# Patient Record
Sex: Male | Born: 1980
Health system: Southern US, Community
[De-identification: ages and names within clinical notes are randomized; demographics above are authoritative.]

## PROBLEM LIST (undated history)

## (undated) DIAGNOSIS — S069XAA Unspecified intracranial injury with loss of consciousness status unknown, initial encounter: Secondary | ICD-10-CM

## (undated) DIAGNOSIS — G43909 Migraine, unspecified, not intractable, without status migrainosus: Secondary | ICD-10-CM

## (undated) DIAGNOSIS — S069X9A Unspecified intracranial injury with loss of consciousness of unspecified duration, initial encounter: Secondary | ICD-10-CM

## (undated) DIAGNOSIS — F32A Depression, unspecified: Secondary | ICD-10-CM

## (undated) DIAGNOSIS — F329 Major depressive disorder, single episode, unspecified: Secondary | ICD-10-CM

## (undated) HISTORY — DX: Major depressive disorder, single episode, unspecified: F32.9

## (undated) HISTORY — DX: Migraine, unspecified, not intractable, without status migrainosus: G43.909

## (undated) HISTORY — DX: Unspecified intracranial injury with loss of consciousness status unknown, initial encounter: S06.9XAA

## (undated) HISTORY — PX: ABDOMINAL SURGERY: SHX537

## (undated) HISTORY — PX: TRACHEAL SURGERY: SHX1096

## (undated) HISTORY — DX: Depression, unspecified: F32.A

## (undated) HISTORY — DX: Unspecified intracranial injury with loss of consciousness of unspecified duration, initial encounter: S06.9X9A

---

## 1999-05-28 ENCOUNTER — Emergency Department (HOSPITAL_COMMUNITY): Admission: EM | Admit: 1999-05-28 | Discharge: 1999-05-28 | Payer: Self-pay | Admitting: Emergency Medicine

## 1999-05-31 ENCOUNTER — Encounter: Payer: Self-pay | Admitting: Emergency Medicine

## 1999-05-31 ENCOUNTER — Emergency Department (HOSPITAL_COMMUNITY): Admission: EM | Admit: 1999-05-31 | Discharge: 1999-05-31 | Payer: Self-pay | Admitting: Emergency Medicine

## 2000-03-19 ENCOUNTER — Emergency Department (HOSPITAL_COMMUNITY): Admission: EM | Admit: 2000-03-19 | Discharge: 2000-03-19 | Payer: Self-pay | Admitting: Emergency Medicine

## 2000-03-19 ENCOUNTER — Encounter: Payer: Self-pay | Admitting: Emergency Medicine

## 2002-01-26 ENCOUNTER — Inpatient Hospital Stay (HOSPITAL_COMMUNITY): Admission: AC | Admit: 2002-01-26 | Discharge: 2002-03-06 | Payer: Self-pay

## 2002-01-26 ENCOUNTER — Encounter: Payer: Self-pay | Admitting: General Surgery

## 2002-01-28 ENCOUNTER — Encounter: Payer: Self-pay | Admitting: General Surgery

## 2002-01-29 ENCOUNTER — Encounter: Payer: Self-pay | Admitting: Neurosurgery

## 2002-01-29 ENCOUNTER — Encounter: Payer: Self-pay | Admitting: General Surgery

## 2002-01-30 ENCOUNTER — Encounter: Payer: Self-pay | Admitting: General Surgery

## 2002-01-31 ENCOUNTER — Encounter: Payer: Self-pay | Admitting: General Surgery

## 2002-02-01 ENCOUNTER — Encounter: Payer: Self-pay | Admitting: General Surgery

## 2002-02-02 ENCOUNTER — Encounter: Payer: Self-pay | Admitting: General Surgery

## 2002-02-04 ENCOUNTER — Encounter: Payer: Self-pay | Admitting: Neurosurgery

## 2002-02-05 ENCOUNTER — Encounter: Payer: Self-pay | Admitting: General Surgery

## 2002-02-06 ENCOUNTER — Encounter: Payer: Self-pay | Admitting: General Surgery

## 2002-02-06 ENCOUNTER — Encounter: Payer: Self-pay | Admitting: Neurosurgery

## 2002-02-08 ENCOUNTER — Encounter: Payer: Self-pay | Admitting: General Surgery

## 2002-02-09 ENCOUNTER — Encounter: Payer: Self-pay | Admitting: General Surgery

## 2002-02-11 ENCOUNTER — Encounter: Payer: Self-pay | Admitting: General Surgery

## 2002-02-12 ENCOUNTER — Encounter: Payer: Self-pay | Admitting: General Surgery

## 2002-02-16 ENCOUNTER — Encounter: Payer: Self-pay | Admitting: General Surgery

## 2002-02-19 ENCOUNTER — Encounter: Payer: Self-pay | Admitting: General Surgery

## 2002-02-23 ENCOUNTER — Encounter: Payer: Self-pay | Admitting: General Surgery

## 2002-03-31 ENCOUNTER — Emergency Department (HOSPITAL_COMMUNITY): Admission: EM | Admit: 2002-03-31 | Discharge: 2002-03-31 | Payer: Self-pay | Admitting: Emergency Medicine

## 2002-04-09 ENCOUNTER — Encounter: Payer: Self-pay | Admitting: General Surgery

## 2002-04-09 ENCOUNTER — Encounter: Admission: RE | Admit: 2002-04-09 | Discharge: 2002-04-09 | Payer: Self-pay | Admitting: General Surgery

## 2002-04-10 ENCOUNTER — Encounter (INDEPENDENT_AMBULATORY_CARE_PROVIDER_SITE_OTHER): Payer: Self-pay | Admitting: *Deleted

## 2002-04-10 ENCOUNTER — Ambulatory Visit (HOSPITAL_BASED_OUTPATIENT_CLINIC_OR_DEPARTMENT_OTHER): Admission: RE | Admit: 2002-04-10 | Discharge: 2002-04-10 | Payer: Self-pay | Admitting: General Surgery

## 2007-12-23 ENCOUNTER — Emergency Department (HOSPITAL_COMMUNITY): Admission: EM | Admit: 2007-12-23 | Discharge: 2007-12-24 | Payer: Self-pay | Admitting: Emergency Medicine

## 2010-10-15 NOTE — Op Note (Signed)
NAME:  Harold Stevens, Harold Stevens                        ACCOUNT NO.:  [id number]   MEDICAL RECORD NO.:  [id number]                   PATIENT TYPE:  AMB   LOCATION:  DSC                                  FACILITY:  [facility]   PHYSICIAN:  Jimmye Norman III, M.D.               DATE OF BIRTH:  Nov 08, 1980   DATE OF PROCEDURE:  04/10/2002  DATE OF DISCHARGE:                                 OPERATIVE REPORT   PREOPERATIVE DIAGNOSIS:  Sebaceous cyst of the left posterior neck.   POSTOPERATIVE DIAGNOSIS:  Sebaceous cyst of the left posterior neck.   OPERATION:  Excision of sebaceous cyst of left posterior neck.   SURGEON:  Jimmye Norman, M.D.   ASSISTANT:  None.   ANESTHESIA:  Monitored anesthesia care with 1% Xylocaine local with  epinephrine, 12 cc were used.   COMPLICATIONS:  None.   CONDITION:  Stable.   INDICATIONS FOR PROCEDURE:  The patient is well known to me as a victim of a  trauma who developed a sebaceous cyst on the left posterior neck which has  been treated with p.o. antibiotics and recurred in size significantly who  now comes in for excision.   DESCRIPTION OF PROCEDURE:  The patient was placed in the prone position with  monitored anesthesia care and oxygenation.  He was subsequently prepped in  the usual sterile manner exposing the cyst on the posterior part of the left  neck.   The cyst itself measured approximately 3 cm in maximal diameter and there  was a punctum in the center.  We anesthetized the total length of 5-5.5-6 cm  in an oval pattern around the cyst on the posterior neck.  A skin marker was  used to maintain adequate orientation.  We then used a 15 blade to excise an  oval piece of skin from the posterior neck enclosing the palpated nodule on  the neck.  Care was taken not to enter the cavity; however, there was small  nick with drainage of some fluid from the cavity during excision.  We did  get the complete capsule of the cyst out of the subcutaneous tissue down  to  the posterior neck fascia.  We irrigated with lidocaine and also with saline  prior to closure and removed the oval piece of tissue with enclosed cyst.   We then closed in two layers with a deep 3-0 Vicryl layer reapproximating  the subcu and then the skin was closed using interrupted 4-0 Prolene  sutures.  Approximately seven sutures interrupted simple stitches were  placed on the skin.  Antibiotic ointment was subsequently placed.  The  patient did get preoperative antibiotics.   The patient was then transferred to the recovery room in stable condition.  Kathrin Ruddy, M.D.    JW/MEDQ  D:  04/10/2002  T:  04/10/2002  Job:  [id number]

## 2012-08-22 DIAGNOSIS — G43909 Migraine, unspecified, not intractable, without status migrainosus: Secondary | ICD-10-CM | POA: Diagnosis not present

## 2012-08-22 DIAGNOSIS — F17200 Nicotine dependence, unspecified, uncomplicated: Secondary | ICD-10-CM | POA: Diagnosis not present

## 2012-08-22 DIAGNOSIS — F329 Major depressive disorder, single episode, unspecified: Secondary | ICD-10-CM | POA: Diagnosis not present

## 2012-08-22 DIAGNOSIS — Z1159 Encounter for screening for other viral diseases: Secondary | ICD-10-CM | POA: Diagnosis not present

## 2012-08-22 DIAGNOSIS — S069X9A Unspecified intracranial injury with loss of consciousness of unspecified duration, initial encounter: Secondary | ICD-10-CM | POA: Diagnosis not present

## 2012-08-22 DIAGNOSIS — Z136 Encounter for screening for cardiovascular disorders: Secondary | ICD-10-CM | POA: Diagnosis not present

## 2012-08-22 DIAGNOSIS — Z20828 Contact with and (suspected) exposure to other viral communicable diseases: Secondary | ICD-10-CM | POA: Diagnosis not present

## 2012-08-22 DIAGNOSIS — Z0000 Encounter for general adult medical examination without abnormal findings: Secondary | ICD-10-CM | POA: Diagnosis not present

## 2012-08-22 DIAGNOSIS — Z113 Encounter for screening for infections with a predominantly sexual mode of transmission: Secondary | ICD-10-CM | POA: Diagnosis not present

## 2012-09-19 DIAGNOSIS — T7840XA Allergy, unspecified, initial encounter: Secondary | ICD-10-CM | POA: Diagnosis not present

## 2012-09-19 DIAGNOSIS — Z23 Encounter for immunization: Secondary | ICD-10-CM | POA: Diagnosis not present

## 2012-09-19 DIAGNOSIS — F329 Major depressive disorder, single episode, unspecified: Secondary | ICD-10-CM | POA: Diagnosis not present

## 2012-09-19 DIAGNOSIS — F17200 Nicotine dependence, unspecified, uncomplicated: Secondary | ICD-10-CM | POA: Diagnosis not present

## 2012-10-09 DIAGNOSIS — R229 Localized swelling, mass and lump, unspecified: Secondary | ICD-10-CM | POA: Diagnosis not present

## 2012-10-09 DIAGNOSIS — R51 Headache: Secondary | ICD-10-CM | POA: Diagnosis not present

## 2012-10-26 ENCOUNTER — Ambulatory Visit (INDEPENDENT_AMBULATORY_CARE_PROVIDER_SITE_OTHER): Payer: Medicare Other | Admitting: Neurology

## 2012-10-26 ENCOUNTER — Encounter: Payer: Self-pay | Admitting: Neurology

## 2012-10-26 VITALS — BP 123/78 | HR 58 | Ht 69.0 in | Wt 186.0 lb

## 2012-10-26 DIAGNOSIS — G44309 Post-traumatic headache, unspecified, not intractable: Secondary | ICD-10-CM

## 2012-10-26 DIAGNOSIS — G43909 Migraine, unspecified, not intractable, without status migrainosus: Secondary | ICD-10-CM | POA: Diagnosis not present

## 2012-10-26 MED ORDER — SUMATRIPTAN SUCCINATE 100 MG PO TABS: 100.0000 mg | ORAL_TABLET | ORAL | Status: DC | PRN

## 2012-10-26 MED ORDER — DIVALPROEX SODIUM ER 500 MG PO TB24: 500.0000 mg | ORAL_TABLET | Freq: Every day | ORAL | Status: DC

## 2012-10-26 NOTE — Progress Notes (Signed)
History of present illness:  Harold Stevens is a 32 years old African American male, referred by his primary care physician Dr.  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, require tracheostomy, he also require prolonged rehabilitation, speech therapy,  Ever since then, he had short-term memory trouble, frequent headaches, he was treated with Depakote for a while, but he denied a history of recurrent seizure,  He now has 5 children, at age 49, 73, 48, for, 2, he lives with his wife and children, he is dealing with anxiety, depression, to the point of suicidal  His typical migraine is right retro-orbital area severe pounding headaches with associated light noise sensitivity, lasting 45 minutes, acute pain is  usually helped by Excedrin Migraine, but still with right side hanging over pain afterwards, he denies significant nausea, he has migraine headaches about 4-5 times each week.  His headache has gotten worse over the past 5 years, trigger for his migraine headaches are hungry, stress, weather change, hot dog,   He is taking Wellbutrin for depression    Review of Systems  Out of a complete 14 system review, the patient complains of only the following symptoms, and all other reviewed systems are negative.   Constitutional:   N/A Cardiovascular:  N/A Ear/Nose/Throat:  N/A Skin: N/A Eyes: blurred vision Respiratory: snoring Gastroitestinal: N/A    Hematology/Lymphatic:  N/A Endocrine:  N/A Musculoskeletal:N/A Allergy/Immunology: N/A Neurological:  Memory loss, confusion, headaches, numbness, dizziness, snoring Psychiatric:    Depression, anxiety disinterested in activities.  PHYSICAL EXAMINATOINS:  Generalized: In no acute distress  Neck: Supple, no carotid bruits    Cardiac: Regular rate rhythm  Pulmonary: Clear to auscultation bilaterally  Musculoskeletal: No deformity  Neurological examination  Mentation: Alert oriented to time, place, history taking, and causual conversation, stuttering of speech, sad looking  Cranial nerve II-XII: Pupils were equal round reactive to light extraocular movements were full, visual field were full on confrontational test. facial sensation and strength were normal. hearing was intact to finger rubbing bilaterally. Uvula tongue midline.  head turning and shoulder shrug and were normal and symmetric.Tongue protrusion into cheek strength was normal.  Motor: normal tone, bulk and strength.  Sensory: Intact to fine touch, pinprick, preserved vibratory sensation, and proprioception at toes.  Coordination: Normal finger to nose, heel-to-shin bilaterally there was no truncal ataxia  Gait: Rising up from seated position without assistance, normal stance, without trunk ataxia, moderate stride, good arm swing, smooth turning, able to perform tiptoe, and heel walking without difficulty.   Romberg signs: Negative  Deep tendon reflexes: Brachioradialis 2/2, biceps 2/2, triceps 2/2, patellar 2/2, Achilles 2/2, plantar responses were flexor bilaterally.  Assessment and plan: 32 Years old right-handed African American male, with past medical history of traumatic brain injury, now presenting with stuttering of speech frequent migraine headaches, short-term memory trouble,  1 MRI of brain 2. For his frequent migraine, start preventive medication Depakote er 500 every night 3 Imitrex as needed 4. RTc in one month

## 2012-11-06 ENCOUNTER — Ambulatory Visit
Admission: RE | Admit: 2012-11-06 | Discharge: 2012-11-06 | Disposition: A | Discharge status: Home / Self Care | Payer: Medicare Other | Source: Ambulatory Visit | Attending: Neurology | Admitting: Neurology

## 2012-11-06 DIAGNOSIS — G44309 Post-traumatic headache, unspecified, not intractable: Secondary | ICD-10-CM

## 2012-11-06 DIAGNOSIS — R51 Headache: Secondary | ICD-10-CM | POA: Diagnosis not present

## 2012-11-06 DIAGNOSIS — S0990XA Unspecified injury of head, initial encounter: Secondary | ICD-10-CM | POA: Diagnosis not present

## 2012-11-06 DIAGNOSIS — G43909 Migraine, unspecified, not intractable, without status migrainosus: Secondary | ICD-10-CM

## 2012-11-09 ENCOUNTER — Telehealth: Payer: Self-pay | Admitting: Neurology

## 2012-11-09 NOTE — Telephone Encounter (Signed)
Message copied by Elisha Headland on Fri Nov 09, 2012  1:08 PM ------      Message from: Levert Feinstein      Created: Fri Nov 09, 2012 10:50 AM       Please call patient, Abnormal MRI brain (without) demonstrating: scaring at the left side of the brain, due to previous history of head trauma, no acute leisions.       ------

## 2012-11-09 NOTE — Telephone Encounter (Signed)
I spoke to patient and relayed abnormal MRI brain, with scaring at the left side of the brain, due to previous history of head trauma, no acute leisons,  per DrTerrace Arabia.

## 2012-11-09 NOTE — Progress Notes (Signed)
Quick Note:  Please call patient, Harold Stevens (without) demonstrating: scaring at the left side of the Stevens, due to previous history of head trauma, no acute leisions.  ______

## 2013-01-09 ENCOUNTER — Emergency Department (HOSPITAL_COMMUNITY)
Admission: EM | Admit: 2013-01-09 | Discharge: 2013-01-09 | Disposition: A | Discharge status: Home / Self Care | Payer: Medicare Other | Attending: Emergency Medicine | Admitting: Emergency Medicine

## 2013-01-09 ENCOUNTER — Encounter (HOSPITAL_COMMUNITY): Payer: Self-pay | Admitting: *Deleted

## 2013-01-09 DIAGNOSIS — Z79899 Other long term (current) drug therapy: Secondary | ICD-10-CM | POA: Diagnosis not present

## 2013-01-09 DIAGNOSIS — Z87820 Personal history of traumatic brain injury: Secondary | ICD-10-CM | POA: Insufficient documentation

## 2013-01-09 DIAGNOSIS — IMO0002 Reserved for concepts with insufficient information to code with codable children: Secondary | ICD-10-CM | POA: Insufficient documentation

## 2013-01-09 DIAGNOSIS — G43909 Migraine, unspecified, not intractable, without status migrainosus: Secondary | ICD-10-CM | POA: Diagnosis not present

## 2013-01-09 DIAGNOSIS — F17200 Nicotine dependence, unspecified, uncomplicated: Secondary | ICD-10-CM | POA: Diagnosis not present

## 2013-01-09 DIAGNOSIS — F329 Major depressive disorder, single episode, unspecified: Secondary | ICD-10-CM | POA: Diagnosis not present

## 2013-01-09 DIAGNOSIS — Y9389 Activity, other specified: Secondary | ICD-10-CM | POA: Insufficient documentation

## 2013-01-09 DIAGNOSIS — Y929 Unspecified place or not applicable: Secondary | ICD-10-CM | POA: Insufficient documentation

## 2013-01-09 DIAGNOSIS — W28XXXA Contact with powered lawn mower, initial encounter: Secondary | ICD-10-CM | POA: Insufficient documentation

## 2013-01-09 DIAGNOSIS — F3289 Other specified depressive episodes: Secondary | ICD-10-CM | POA: Insufficient documentation

## 2013-01-09 NOTE — ED Notes (Signed)
Laceration rt little finger with a lawnmower blade just pta.  It occurred at 1930 bandaged bleeding controlled at present.  Initially the bleeding would not stop

## 2013-01-09 NOTE — ED Notes (Signed)
Avulsion to right pinky finger. Bleeding controlled. Sensation intact. Able to manipulate digit

## 2013-01-12 NOTE — ED Provider Notes (Signed)
Medical screening examination/treatment/procedure(s) were performed by non-physician practitioner and as supervising physician I was immediately available for consultation/collaboration.  Mujtaba Bollig M Doris Mcgilvery, MD 01/12/13 0750 

## 2013-01-12 NOTE — ED Provider Notes (Signed)
CSN: [id number]     Arrival date & time 01/09/13  2134 History     None    Chief Complaint  Patient presents with  . Laceration   HPI  History provided by the patient. The patient is a 32 year old male who presents with complaint of laceration to his right little finger. Patient was mowing the lawn and had reached under the lawnmower to clear out some grass when the blade assembly moved in his right pinky finger. He had laceration to the tip of the finger with bleeding. Patient states she was having difficulty controlling the bleeding initially. He was able to have enough pressure and bandage to finally stop the bleeding. He came for further evaluation and treatment. He reports being current on his tetanus within the last 5 years. Denies any other aggravating or alleviating factors. No other associated symptoms. Pains are moderate.    Past Medical History  Diagnosis Date  . Migraine   . Depression   . Brain injury    Past Surgical History  Procedure Laterality Date  . Tracheal surgery      2003  . Abdominal surgery      2003   No family history on file. History  Substance Use Topics  . Smoking status: Current Every Day Smoker    Types: Cigarettes  . Smokeless tobacco: Never Used  . Alcohol Use: 1.0 oz/week    2 drink(s) per week     Comment: Drinks beer on Saturdays    Review of Systems  Neurological: Negative for weakness and numbness.  All other systems reviewed and are negative.    Allergies  Review of patient's allergies indicates no known allergies.  Home Medications   Current Outpatient Rx  Name  Route  Sig  Dispense  Refill  . Aspirin-Acetaminophen-Caffeine (EXCEDRIN PO)   Oral   Take 1 tablet by mouth daily as needed (headache).          Marland Kitchen buPROPion (WELLBUTRIN SR) 150 MG 12 hr tablet   Oral   Take 150 mg by mouth daily.         . divalproex (DEPAKOTE ER) 500 MG 24 hr tablet   Oral   Take 1 tablet (500 mg total) by mouth daily.   30 tablet  12   . SUMAtriptan (IMITREX) 100 MG tablet   Oral   Take 1 tablet (100 mg total) by mouth every 2 (two) hours as needed for migraine. Maximum 2 tabs q 24hours.   15 tablet   6    BP 155/83  Pulse 65  Temp(Src) 98.3 F (36.8 C) (Oral)  Resp 18  SpO2 98% Physical Exam  Nursing note and vitals reviewed. Constitutional: He is oriented to person, place, and time. He appears well-developed and well-nourished. No distress.  HENT:  Head: Normocephalic.  Cardiovascular: Normal rate and regular rhythm.   Pulmonary/Chest: Effort normal and breath sounds normal. No respiratory distress. He has no wheezes. He has no rales.  Abdominal: Soft.  Musculoskeletal: Normal range of motion.  Small avulsion/amputation-type laceration to the tip in pad of the right fifth finger. Bleeding is controlled. Normal range of motion to the joints of the finger.  Neurological: He is alert and oriented to person, place, and time.  Skin: Skin is warm.  Psychiatric: He has a normal mood and affect. His behavior is normal.    ED Course   Procedures  1. Amputation of finger tip, initial encounter     MDM  Patient seen  and evaluated. The patient appears well. There is very small amputation/avulsion to the pad of fingertip. Bleeding is controlled.  Wound was cleaned, irrigated and bandage with bacitracin ointment.  Angus Seller, PA-C 01/12/13 [phone]

## 2013-02-07 DIAGNOSIS — R0602 Shortness of breath: Secondary | ICD-10-CM | POA: Diagnosis not present

## 2013-02-07 DIAGNOSIS — F17200 Nicotine dependence, unspecified, uncomplicated: Secondary | ICD-10-CM | POA: Diagnosis not present

## 2013-02-07 DIAGNOSIS — F329 Major depressive disorder, single episode, unspecified: Secondary | ICD-10-CM | POA: Diagnosis not present

## 2013-02-07 DIAGNOSIS — Z136 Encounter for screening for cardiovascular disorders: Secondary | ICD-10-CM | POA: Diagnosis not present

## 2013-02-20 ENCOUNTER — Ambulatory Visit: Payer: Medicare Other | Admitting: Neurology

## 2013-03-21 DIAGNOSIS — F17200 Nicotine dependence, unspecified, uncomplicated: Secondary | ICD-10-CM | POA: Diagnosis not present

## 2013-03-21 DIAGNOSIS — IMO0001 Reserved for inherently not codable concepts without codable children: Secondary | ICD-10-CM | POA: Diagnosis not present

## 2013-03-21 DIAGNOSIS — G43909 Migraine, unspecified, not intractable, without status migrainosus: Secondary | ICD-10-CM | POA: Diagnosis not present

## 2013-03-21 DIAGNOSIS — Z23 Encounter for immunization: Secondary | ICD-10-CM | POA: Diagnosis not present

## 2013-03-21 DIAGNOSIS — F329 Major depressive disorder, single episode, unspecified: Secondary | ICD-10-CM | POA: Diagnosis not present

## 2013-03-26 ENCOUNTER — Encounter: Payer: Self-pay | Admitting: Neurology

## 2013-03-26 ENCOUNTER — Ambulatory Visit (INDEPENDENT_AMBULATORY_CARE_PROVIDER_SITE_OTHER): Payer: Medicare Other | Admitting: Neurology

## 2013-03-26 VITALS — BP 138/81 | HR 65 | Ht 69.0 in | Wt 185.0 lb

## 2013-03-26 DIAGNOSIS — G43909 Migraine, unspecified, not intractable, without status migrainosus: Secondary | ICD-10-CM

## 2013-03-26 DIAGNOSIS — G44309 Post-traumatic headache, unspecified, not intractable: Secondary | ICD-10-CM | POA: Diagnosis not present

## 2013-03-26 MED ORDER — RIZATRIPTAN BENZOATE 10 MG PO TBDP: 10.0000 mg | ORAL_TABLET | ORAL | Status: DC | PRN

## 2013-03-26 NOTE — Progress Notes (Signed)
History of present illness:  Harold Stevens is a 32 years old African American male, referred by his primary care physician Dr.  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, require tracheostomy, he also require prolonged rehabilitation, speech therapy,  Ever since then, he had short-term memory trouble, frequent headaches, he was treated with Depakote for a while, but he denied a history of recurrent seizure,  He now has 5 children, at age 64, 67, 1, for, 2, he lives with his wife and children, he is dealing with anxiety, depression, to the point of suicidal  His typical migraine is right retro-orbital area severe pounding headaches with associated light noise sensitivity, lasting 45 minutes, acute pain is  usually helped by Excedrin Migraine, but still with right side hanging over pain afterwards, he denies significant nausea, he has migraine headaches about 4-5 times each week.  His headache has gotten worse over the past 5 years, trigger for his migraine headaches are hungry, stress, weather change, hot dog,   He is taking Wellbutrin for depression    UPDATE 03/26/2013:  MRI brain showed focal areas of cortical encephalomalacia and gliosis in the left posterior frontal, left parasagittal parietal and left posterior temporal regions. Scattered foci of non-specific gliosis in the left centrum semiovale. These may be sequelae from remote head trauma.  He took Depakote ER 500mg  qhs, he has migraine 3-4 times a week, He took imitrex 3-4 times each week as needed for his migraine, followed by drowsiness afterwards.  He had couple episodes with flashing light in his visual field followed by migraine headaches.    Review of Systems  Out of a complete 14 system review, the patient  complains of only the following symptoms, and all other reviewed systems are negative.   Blurred vision, eye pain, snoring, memory loss, confusion, dizziness, depression, anxiety, suicidal thoughts, racing thoughts,  PHYSICAL EXAMINATOINS:  Generalized: In no acute distress  Neck: Supple, no carotid bruits   Cardiac: Regular rate rhythm  Pulmonary: Clear to auscultation bilaterally  Musculoskeletal: No deformity  Neurological examination  Mentation: He has stuttering of speech, sad looking  Cranial nerve II-XII: Pupils were equal round reactive to light extraocular movements were full, visual field were full on confrontational test. facial sensation and strength were normal. hearing was intact to finger rubbing bilaterally. Uvula tongue midline.  head turning and shoulder shrug and were normal and symmetric.Tongue protrusion into cheek strength was normal.  Motor: normal tone, bulk and strength.  Sensory: Intact to fine touch, pinprick, preserved vibratory sensation, and proprioception at toes.  Coordination: Normal finger to nose, heel-to-shin bilaterally there was no truncal ataxia  Gait: Rising up from seated position without assistance, normal stance, without trunk ataxia, moderate stride, good arm swing, smooth turning, able to perform tiptoe, and heel walking without difficulty.   Romberg signs: Negative  Deep tendon reflexes: Brachioradialis 2/2, biceps 2/2, triceps 2/2, patellar 2/2, Achilles 2/2, plantar responses were flexor bilaterally.  Assessment and plan: 32 Years old right-handed African American male, with past medical history of traumatic brain injury, now presenting with stuttering of speech, frequent migraine headaches, short-term memory trouble.  MRI brain showed focal areas of cortical encephalomalacia and gliosis in the left posterior frontal, left parasagittal parietal and left posterior  temporal regions. Scattered foci of non-specific gliosis in the left  centrum semiovale. These may be sequelae from remote head trauma.  1. This is most consistent with migraine headaches, he reported 80% improvement with Depakote, we will continue Depakote 500 mg every night. 2.  Maxalt as needed for migraine. 3. RTC in 6 months with Harold Stevens.

## 2013-05-20 ENCOUNTER — Telehealth: Payer: Self-pay | Admitting: *Deleted

## 2013-05-20 MED ORDER — PREDNISONE 5 MG PO TABS: ORAL_TABLET | ORAL | Status: DC

## 2013-05-20 NOTE — Telephone Encounter (Signed)
I called patient. The patient is having an increase in the frequency of the headaches, occurring several times during the day. I will call in a prescription for prednisone Dosepak at this time. The patient indicates that at the works well when he gets the headache.

## 2013-05-20 NOTE — Telephone Encounter (Signed)
Patient said that the migraines have been going on for the past month, worse in the last week and a half.  He has migraines 3 times at night( so bad that they wake him up), 2 times during daytime but he can tolerate those. The medication that he is taking is not helping. The rizatriptan can not be refilled by pharmacist until Jan.

## 2013-09-19 DIAGNOSIS — F329 Major depressive disorder, single episode, unspecified: Secondary | ICD-10-CM | POA: Diagnosis not present

## 2013-09-19 DIAGNOSIS — F17200 Nicotine dependence, unspecified, uncomplicated: Secondary | ICD-10-CM | POA: Diagnosis not present

## 2013-09-19 DIAGNOSIS — G43909 Migraine, unspecified, not intractable, without status migrainosus: Secondary | ICD-10-CM | POA: Diagnosis not present

## 2013-09-25 ENCOUNTER — Other Ambulatory Visit: Payer: Self-pay | Admitting: Nurse Practitioner

## 2013-09-25 ENCOUNTER — Encounter: Payer: Self-pay | Admitting: Nurse Practitioner

## 2013-09-25 ENCOUNTER — Ambulatory Visit (INDEPENDENT_AMBULATORY_CARE_PROVIDER_SITE_OTHER): Payer: Medicare Other | Admitting: Nurse Practitioner

## 2013-09-25 ENCOUNTER — Encounter (INDEPENDENT_AMBULATORY_CARE_PROVIDER_SITE_OTHER): Payer: Self-pay

## 2013-09-25 VITALS — BP 126/75 | HR 68 | Ht 71.0 in | Wt 188.0 lb

## 2013-09-25 DIAGNOSIS — G44309 Post-traumatic headache, unspecified, not intractable: Secondary | ICD-10-CM | POA: Diagnosis not present

## 2013-09-25 DIAGNOSIS — G43909 Migraine, unspecified, not intractable, without status migrainosus: Secondary | ICD-10-CM | POA: Diagnosis not present

## 2013-09-25 MED ORDER — SUMATRIPTAN SUCCINATE 100 MG PO TABS: 100.0000 mg | ORAL_TABLET | ORAL | Status: DC | PRN

## 2013-09-25 MED ORDER — DIVALPROEX SODIUM ER 500 MG PO TB24: 1000.0000 mg | ORAL_TABLET | Freq: Every day | ORAL | Status: DC

## 2013-09-25 NOTE — Patient Instructions (Signed)
Increase Depakote to 2 tabs daily, will reorder Imitrex 100 mg when necessary acute migraine, will reorder Given a list of foods and environmental  triggers for migraine Claritin or Allegra over-the-counter for allergies Followup in 6 months

## 2013-09-25 NOTE — Progress Notes (Signed)
[facility]  PATIENT: Harold Stevens DOB: 01/27/1981   REASON FOR VISIT: Followup for headaches    HISTORY OF PRESENT ILLNESS: Harold Stevens, 33 year old male returns for followup. He was last seen by Dr. Terrace ArabiaYan 03/26/2013. At that time his headaches were well controlled on Depakote 500 mg daily. He tells me today he has had an increase in the number of headaches recently. He also says the Maxalt does not work acutely. He does not have any Imitrex.MRI brain showed focal areas of cortical encephalomalacia and gliosis in the left posterior frontal, left parasagittal parietal and left posterior temporal regions. Scattered foci of non-specific gliosis in the left centrum semiovale. These may be sequelae from remote head trauma. He also complains of seasonal allergies. He returns for reevaluation.   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, require tracheostomy, he also require prolonged rehabilitation, speech therapy,  Ever since then, he had short-term memory trouble, frequent headaches, he was treated with Depakote for a while, but he denied a history of recurrent seizure,  He now has 5 children, at age 34, 5512, 508, for, 2, he lives with his wife and children, he is dealing with anxiety, depression, to the point of suicidal  His typical migraine is right retro-orbital area severe pounding headaches with associated light noise sensitivity, lasting 45 minutes, acute pain is usually helped by Excedrin Migraine, but still with right side hanging over pain afterwards, he denies significant nausea, he has migraine headaches about 4-5 times each week. His headache has gotten worse over the past 5 years, trigger for his migraine headaches are hungry, stress, weather change, hot dog,  He is  taking Wellbutrin for depression  UPDATE 03/26/2013:  MRI brain showed focal areas of cortical encephalomalacia and gliosis in the left posterior frontal, left parasagittal parietal and left posterior temporal regions. Scattered foci of non-specific gliosis in the left centrum semiovale. These may be sequelae from remote head trauma.  He took Depakote ER 500mg  qhs, he has migraine 3-4 times a week, He took imitrex 3-4 times each week as needed for his migraine, followed by drowsiness afterwards. He had couple episodes with flashing light in his visual field followed by migraine headaches.    REVIEW OF SYSTEMS: Full 14 system review of systems performed and notable only for those listed, all others are neg:  Constitutional: N/A  Cardiovascular: N/A  Ear/Nose/Throat: N/A  Skin: N/A  Eyes: N/A  Respiratory: N/A  Gastroitestinal: N/A  Hematology/Lymphatic: N/A  Endocrine: N/A Musculoskeletal:N/A  Allergy/Immunology: N/A  Neurological:  headache , memory loss  Psychiatric:  depression and anxiety   ALLERGIES: No Known Allergies  HOME MEDICATIONS: Outpatient Prescriptions Prior to Visit  Medication Sig Dispense Refill  . Aspirin-Acetaminophen-Caffeine (EXCEDRIN PO) Take 1 tablet by mouth daily as needed (headache).       Marland Kitchen. buPROPion (WELLBUTRIN SR) 150 MG 12 hr tablet Take 150 mg by mouth daily.      . chlorhexidine (PERIDEX) 0.12 % solution       . divalproex (DEPAKOTE ER) 500 MG 24 hr tablet Take 1 tablet (500 mg total) by mouth daily.  30 tablet  12  . predniSONE (DELTASONE) 5 MG tablet Began taking 6 tablets daily, taper by one tablet daily until off the medication.  21 tablet  0  . rizatriptan (MAXALT-MLT) 10 MG disintegrating tablet Take 1 tablet (10 mg total) by mouth as needed for migraine. May repeat in 2 hours if needed  15 tablet  12  . SUMAtriptan (IMITREX) 100 MG tablet Take 1 tablet (100 mg total) by mouth every 2 (two) hours as needed for migraine. Maximum 2 tabs q 24hours.   15 tablet  6   No facility-administered medications prior to visit.    PAST MEDICAL HISTORY: Past Medical History  Diagnosis Date  . Migraine   . Depression   . Brain injury     PAST SURGICAL HISTORY: Past Surgical History  Procedure Laterality Date  . Tracheal surgery      2003  . Abdominal surgery      2003    FAMILY HISTORY: History reviewed. No pertinent family history.  SOCIAL HISTORY: History   Social History  . Marital Status: Single    Spouse Name: Stepahnie    Number of Children: 5  . Years of Education: N/A   Occupational History  .      college student   Social History Main Topics  . Smoking status: Current Every Day Smoker    Types: Cigarettes  . Smokeless tobacco: Never Used  . Alcohol Use: 1.0 oz/week    2 drink(s) per week     Comment: Drinks beer on Saturdays  . Drug Use: No  . Sexual Activity: Not on file   Other Topics Concern  . Not on file   Social History Narrative   Patient lives at home with his wife Judeth Cornfield). Patient has five children. Patient goes to college  Part time , He wants to teach. Right handed.   Caffeine- one cup daily.     PHYSICAL EXAM  Filed Vitals:   09/25/13 1021  BP: 126/75  Pulse: 68  Height: 5\' 11"  (1.803 m)  Weight: 188 lb (85.276 kg)   Body mass index is 26.23 kg/(m^2).  Generalized: Well developed, in no acute distress  Head: normocephalic and atraumatic,. Oropharynx benign  Neck: Supple, no carotid bruits  Cardiac: Regular rate rhythm, no murmur  Musculoskeletal: No deformity   Neurological examination   Mentation: Alert oriented to time, place, history taking. Follows all commands,  Stuttering speech , word finding difficulty. Cranial nerve II-XII: Fundoscopic exam reveals sharp disc margins.Pupils were equal round reactive to light extraocular movements were full, visual field were full on confrontational test. Facial sensation and strength were normal. hearing was intact to finger  rubbing bilaterally. Uvula tongue midline. head turning and shoulder shrug were normal and symmetric.Tongue protrusion into cheek strength was normal. Motor: normal bulk and tone, full strength in the BUE, BLE, fine finger movements normal, no pronator drift. No focal weakness Sensory: normal and symmetric to light touch, pinprick, and  vibration  Coordination: finger-nose-finger, heel-to-shin bilaterally, no dysmetria Reflexes: Brachioradialis 2/2, biceps 2/2, triceps 2/2, patellar 2/2, Achilles 2/2, plantar responses were flexor bilaterally. Gait and Station: Rising up from seated position without assistance, normal stance,  moderate stride, good arm swing, smooth turning, able to perform tiptoe, and heel walking without difficulty. Tandem gait is steady  DIAGNOSTIC DATA (LABS, IMAGING, TESTING) -     ASSESSMENT AND PLAN  33 y.o. year old male  has a past medical history of Migraine; Depression; and Brain injury. here  to followup. His headaches have increased slightly and he is currently tolerating Depakote 500mg   without side effects.    Increase Depakote to 2  tabs daily, will reorder Imitrex 100 mg when necessary acute migraine, will reorder Given a list of foods and environmental  triggers for migraine Claritin or Allegra over-the-counter for allergies Followup in 6 months Nilda RiggsNancy Carolyn Martin, [facility] LtdGNP, [facility] WorthBC, APRN  [facility] [address], [address] [place], [place] [address] [phone]

## 2013-10-19 ENCOUNTER — Emergency Department (INDEPENDENT_AMBULATORY_CARE_PROVIDER_SITE_OTHER): Payer: Medicare Other

## 2013-10-19 ENCOUNTER — Emergency Department (INDEPENDENT_AMBULATORY_CARE_PROVIDER_SITE_OTHER)
Admission: EM | Admit: 2013-10-19 | Discharge: 2013-10-19 | Disposition: A | Discharge status: Home / Self Care | Payer: Medicare Other | Source: Home / Self Care | Attending: Family Medicine | Admitting: Family Medicine

## 2013-10-19 ENCOUNTER — Encounter (HOSPITAL_COMMUNITY): Payer: Self-pay | Admitting: Emergency Medicine

## 2013-10-19 DIAGNOSIS — W108XXA Fall (on) (from) other stairs and steps, initial encounter: Secondary | ICD-10-CM

## 2013-10-19 DIAGNOSIS — S46912A Strain of unspecified muscle, fascia and tendon at shoulder and upper arm level, left arm, initial encounter: Secondary | ICD-10-CM

## 2013-10-19 DIAGNOSIS — Y9229 Other specified public building as the place of occurrence of the external cause: Secondary | ICD-10-CM

## 2013-10-19 DIAGNOSIS — M25519 Pain in unspecified shoulder: Secondary | ICD-10-CM | POA: Diagnosis not present

## 2013-10-19 DIAGNOSIS — IMO0002 Reserved for concepts with insufficient information to code with codable children: Secondary | ICD-10-CM

## 2013-10-19 MED ORDER — IBUPROFEN 800 MG PO TABS
ORAL_TABLET | ORAL | Status: AC
  Filled 2013-10-19: qty 1

## 2013-10-19 MED ORDER — NAPROXEN 500 MG PO TABS: 500.0000 mg | ORAL_TABLET | Freq: Two times a day (BID) | ORAL | Status: DC

## 2013-10-19 MED ORDER — IBUPROFEN 800 MG PO TABS
800.0000 mg | ORAL_TABLET | Freq: Once | ORAL | Status: AC
  Administered 2013-10-19: 800 mg via ORAL

## 2013-10-19 NOTE — ED Notes (Signed)
Patient complains of left shoulder pain and thinks he dislocated his left shoulder 4 days ago after going down some stairs too fast; states that he think it went back in but still feels strange.

## 2013-10-19 NOTE — Discharge Instructions (Signed)
It was nice to meet you today. Your xray were without evidence of broken bones or dislocation of your shoulder. You have likely sprained or strained your left shoulder as a result of your fall 4 days ago. You may also have a small tear in the muscle near the front of your shoulder (called a rotator cuff injury). If your shoulder discomfort persists, I would recommend that you follow up at the orthopedic urgent care facility that I have given you the information for so you can have additional xrays and treatment. Use medication as prescribed for pain and ice to reduce inflammation.  Shoulder Sprain A shoulder sprain is the result of damage to the tough, fiber-like tissues (ligaments) that help hold your shoulder in place. The ligaments may be stretched or torn. Besides the main shoulder joint (the ball and socket), there are several smaller joints that connect the bones in this area. A sprain usually involves one of those joints. Most often it is the acromioclavicular (or AC) joint. That is the joint that connects the collarbone (clavicle) and the shoulder blade (scapula) at the top point of the shoulder blade (acromion). A shoulder sprain is a mild form of what is called a shoulder separation. Recovering from a shoulder sprain may take some time. For some, pain lingers for several months. Most people recover without long term problems. CAUSES   A shoulder sprain is usually caused by some kind of trauma. This might be:  Falling on an outstretched arm.  Being hit hard on the shoulder.  Twisting the arm.  Shoulder sprains are more likely to occur in people who:  Play sports.  Have balance or coordination problems. SYMPTOMS   Pain when you move your shoulder.  Limited ability to move the shoulder.  Swelling and tenderness on top of the shoulder.  Redness or warmth in the shoulder.  Bruising.  A change in the shape of the shoulder. DIAGNOSIS  Your healthcare provider may:  Ask about  your symptoms.  Ask about recent activity that might have caused those symptoms.  Examine your shoulder. You may be asked to do simple exercises to test movement. The other shoulder will be examined for comparison.  Order some tests that provide a look inside the body. They can show the extent of the injury. The tests could include:  X-rays.  CT (computed tomography) scan.  MRI (magnetic resonance imaging) scan. RISKS AND COMPLICATIONS  Loss of full shoulder motion.  Ongoing shoulder pain. TREATMENT  How long it takes to recover from a shoulder sprain depends on how severe it was. Treatment options may include:  Rest. You should not use the arm or shoulder until it heals.  Ice. For 2 or 3 days after the injury, put an ice pack on the shoulder up to 4 times a day. It should stay on for 15 to 20 minutes each time. Wrap the ice in a towel so it does not touch your skin.  Over-the-counter medicine to relieve pain.  A sling or brace. This will keep the arm still while the shoulder is healing.  Physical therapy or rehabilitation exercises. These will help you regain strength and motion. Ask your healthcare provider when it is OK to begin these exercises.  Surgery. The need for surgery is rare with a sprained shoulder, but some people may need surgery to keep the joint in place and reduce pain. HOME CARE INSTRUCTIONS   Ask your healthcare provider about what you should and should not do while  your shoulder heals.  Make sure you know how to apply ice to the correct area of your shoulder.  Talk with your healthcare provider about which medications should be used for pain and swelling.  If rehabilitation therapy will be needed, ask your healthcare provider to refer you to a therapist. If it is not recommended, then ask about at-home exercises. Find out when exercise should begin. SEEK MEDICAL CARE IF:  Your pain, swelling, or redness at the joint increases. SEEK IMMEDIATE MEDICAL  CARE IF:   You have a fever.  You cannot move your arm or shoulder. Document Released: 10/02/2008 Document Revised: 08/08/2011 Document Reviewed: 10/02/2008 [facility] Patient Information 2014 [employer] [place].

## 2013-10-19 NOTE — ED Provider Notes (Signed)
CSN: [id number]     Arrival date & time 10/19/13  1450 History   First MD Initiated Contact with Patient 10/19/13 1641     Chief Complaint  Patient presents with  . Dislocation   (Consider location/radiation/quality/duration/timing/severity/associated sxs/prior Treatment) HPI Comments: Patient reports that when he was coming down the stairs at his child's school four days ago he slipped and as he began to fall down the stairs he grabbed the railing with his left hand pulling his left arm above and behind his head. His left shoulder has remained uncomfortable with movement since his injury.  Denies previous injury or surgery.   The history is provided by the patient.    Past Medical History  Diagnosis Date  . Migraine   . Depression   . Brain injury    Past Surgical History  Procedure Laterality Date  . Tracheal surgery      2003  . Abdominal surgery      2003   No family history on file. History  Substance Use Topics  . Smoking status: Current Every Day Smoker    Types: Cigarettes  . Smokeless tobacco: Never Used  . Alcohol Use: 1.0 oz/week    2 drink(s) per week     Comment: Drinks beer on Saturdays    Review of Systems  All other systems reviewed and are negative.   Allergies  Review of patient's allergies indicates no known allergies.  Home Medications   Prior to Admission medications   Medication Sig Start Date End Date Taking? Authorizing Provider  Aspirin-Acetaminophen-Caffeine (EXCEDRIN PO) Take 1 tablet by mouth daily as needed (headache).    Yes Historical Provider, MD  buPROPion (WELLBUTRIN SR) 150 MG 12 hr tablet Take 150 mg by mouth daily.   Yes Historical Provider, MD  divalproex (DEPAKOTE ER) 500 MG 24 hr tablet TAKE 2 TABLETS BY MOUTH EVERY DAY. 09/25/13  Yes Levert Feinstein, MD  SUMAtriptan (IMITREX) 100 MG tablet Take 1 tablet (100 mg total) by mouth every 2 (two) hours as needed for migraine. Maximum 2 tabs q 24hours. 09/25/13  Yes Nilda Riggs,  NP  chlorhexidine (PERIDEX) 0.12 % solution  12/28/12   Historical Provider, MD  naproxen (NAPROSYN) 500 MG tablet Take 1 tablet (500 mg total) by mouth 2 (two) times daily with a meal. As needed for pain 10/19/13   Ardis Rowan, PA  predniSONE (DELTASONE) 5 MG tablet Began taking 6 tablets daily, taper by one tablet daily until off the medication. 05/20/13   York Spaniel, MD  rizatriptan (MAXALT-MLT) 10 MG disintegrating tablet Take 1 tablet (10 mg total) by mouth as needed for migraine. May repeat in 2 hours if needed 03/26/13   Levert Feinstein, MD   BP 143/83  Pulse 57  Temp(Src) 98.6 F (37 C) (Oral)  Resp 17  SpO2 100% Physical Exam  Nursing note and vitals reviewed. Constitutional: He is oriented to person, place, and time. He appears well-developed and well-nourished. No distress.  HENT:  Head: Normocephalic and atraumatic.  Eyes: Conjunctivae are normal. No scleral icterus.  Cardiovascular: Normal rate, regular rhythm and normal heart sounds.   Pulmonary/Chest: Effort normal and breath sounds normal. No respiratory distress. He has no wheezes.  Musculoskeletal: Normal range of motion.       Left shoulder: He exhibits tenderness. He exhibits normal range of motion, no bony tenderness, no swelling, no effusion, no crepitus, no deformity, no laceration, normal pulse and normal strength.  Arms: Neurological: He is alert and oriented to person, place, and time.  Skin: Skin is warm and dry.  Psychiatric: He has a normal mood and affect. His behavior is normal.    ED Course  Procedures (including critical care time) Labs Review Labs Reviewed - No data to display  Imaging Review Dg Shoulder Left  10/19/2013   CLINICAL DATA:  Left shoulder pain  EXAM: LEFT SHOULDER - 2+ VIEW  COMPARISON:  None.  FINDINGS: There is no evidence of fracture or dislocation. There is no evidence of arthropathy or other focal bone abnormality. Soft tissues are unremarkable.  IMPRESSION: Negative.    Electronically Signed   By: Marlan Palauharles  Clark M.D.   On: 10/19/2013 17:54     MDM   1. Left shoulder strain   Advised rest, ice and naprosyn as ordered. States to patient that he can use sling for brief period of time during day as needed for comfort, but should not wear sling all day. Suggested to his that if his symptoms do not seem to improve over the next 5-7 days with above therapies he may wish to be evaluated at [facility] [address] for possible rotator cuff tear. Printed information regarding clinic provided to the patient.   Jess BartersJennifer Lee Union[place], [place] 10/19/13 2111

## 2013-10-21 NOTE — ED Provider Notes (Signed)
Medical screening examination/treatment/procedure(s) were performed by resident physician or non-physician practitioner and as supervising physician I was immediately available for consultation/collaboration.   Harkirat Orozco DOUGLAS MD.   Alaze Garverick D Gigi Onstad, MD 10/21/13 1317 

## 2013-11-16 ENCOUNTER — Other Ambulatory Visit: Payer: Self-pay | Admitting: Neurology

## 2013-11-18 NOTE — Telephone Encounter (Signed)
Last OV says: Increase Depakote to 2 tabs daily

## 2014-03-28 ENCOUNTER — Ambulatory Visit: Payer: Medicare Other | Admitting: Neurology

## 2014-04-01 ENCOUNTER — Encounter: Payer: Self-pay | Admitting: Neurology

## 2014-04-01 ENCOUNTER — Ambulatory Visit (INDEPENDENT_AMBULATORY_CARE_PROVIDER_SITE_OTHER): Payer: Medicare Other | Admitting: Neurology

## 2014-04-01 ENCOUNTER — Other Ambulatory Visit: Payer: Self-pay | Admitting: Neurology

## 2014-04-01 VITALS — BP 131/79 | HR 58 | Ht 68.0 in | Wt 182.0 lb

## 2014-04-01 DIAGNOSIS — G43919 Migraine, unspecified, intractable, without status migrainosus: Secondary | ICD-10-CM

## 2014-04-01 DIAGNOSIS — G44309 Post-traumatic headache, unspecified, not intractable: Secondary | ICD-10-CM

## 2014-04-01 MED ORDER — NORTRIPTYLINE HCL 10 MG PO CAPS: ORAL_CAPSULE | ORAL | Status: DC

## 2014-04-01 MED ORDER — SUMATRIPTAN SUCCINATE 100 MG PO TABS: 100.0000 mg | ORAL_TABLET | ORAL | Status: DC | PRN

## 2014-04-01 MED ORDER — DIVALPROEX SODIUM ER 500 MG PO TB24: 500.0000 mg | ORAL_TABLET | Freq: Every day | ORAL | Status: DC
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
## 2014-05-01 ENCOUNTER — Encounter: Payer: Self-pay | Admitting: *Deleted

## 2014-05-01 ENCOUNTER — Ambulatory Visit: Payer: Medicare Other | Admitting: Neurology

## 2014-05-01 DIAGNOSIS — Z0289 Encounter for other administrative examinations: Secondary | ICD-10-CM

## 2014-06-26 DIAGNOSIS — F329 Major depressive disorder, single episode, unspecified: Secondary | ICD-10-CM | POA: Diagnosis not present

## 2014-06-26 DIAGNOSIS — G4730 Sleep apnea, unspecified: Secondary | ICD-10-CM | POA: Diagnosis not present

## 2014-06-26 DIAGNOSIS — G43909 Migraine, unspecified, not intractable, without status migrainosus: Secondary | ICD-10-CM | POA: Diagnosis not present

## 2014-06-26 DIAGNOSIS — B351 Tinea unguium: Secondary | ICD-10-CM | POA: Diagnosis not present

## 2014-07-01 ENCOUNTER — Ambulatory Visit: Payer: Medicare Other | Admitting: Neurology

## 2014-07-03 ENCOUNTER — Encounter: Payer: Self-pay | Admitting: Neurology

## 2014-07-15 DIAGNOSIS — G4733 Obstructive sleep apnea (adult) (pediatric): Secondary | ICD-10-CM | POA: Diagnosis not present

## 2014-08-27 ENCOUNTER — Other Ambulatory Visit: Payer: Self-pay | Admitting: Neurology

## 2014-09-22 ENCOUNTER — Telehealth: Payer: Self-pay | Admitting: Neurology

## 2014-09-22 NOTE — Telephone Encounter (Signed)
Patient stated he's been experiencing extreme cluster migraines for the last 1 1/2 weeks at night.  Patient afraid to go to sleep due to having 3 to 4 migraines during the night.  Questioning what could be get to relieve migraines.  Please call and advise.

## 2014-09-22 NOTE — Telephone Encounter (Signed)
Called patient who states he is having an increase in migraines.  He is coming in this week for an appt to evaluate.

## 2014-09-26 ENCOUNTER — Encounter: Payer: Self-pay | Admitting: Neurology

## 2014-09-26 ENCOUNTER — Ambulatory Visit (INDEPENDENT_AMBULATORY_CARE_PROVIDER_SITE_OTHER): Payer: Medicare Other | Admitting: Neurology

## 2014-09-26 VITALS — BP 138/84 | HR 60 | Ht 69.0 in | Wt 196.0 lb

## 2014-09-26 DIAGNOSIS — G43919 Migraine, unspecified, intractable, without status migrainosus: Secondary | ICD-10-CM

## 2014-09-26 DIAGNOSIS — G44309 Post-traumatic headache, unspecified, not intractable: Secondary | ICD-10-CM | POA: Diagnosis not present

## 2014-09-26 MED ORDER — INDOMETHACIN 25 MG PO CAPS: 25.0000 mg | ORAL_CAPSULE | Freq: Two times a day (BID) | ORAL | Status: DC

## 2014-09-26 MED ORDER — LIDOCAINE HCL 2 % EX LIQD: 5.0000 mL | CUTANEOUS | Status: AC | PRN

## 2014-09-26 MED ORDER — TOPIRAMATE 100 MG PO TABS: 100.0000 mg | ORAL_TABLET | Freq: Two times a day (BID) | ORAL | Status: DC

## 2014-09-26 NOTE — Progress Notes (Signed)
[facility]  PATIENT: Harold Stevens DOB: 28-Dec-1980    HISTORY OF PRESENT ILLNESS: Harold Stevens, 34 year old male returns for followup.   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  Ever since then, he had short-term memory trouble, frequent headaches, he was treated with Depakote for a while, but he denied a history of recurrent seizure,  He now has 5 children, at age 69, 53, 80, for, 2, he lives with his wife and children, he is dealing with anxiety, depression, to the point of suicidal  His typical migraine is right retro-orbital area severe pounding headaches with associated light noise sensitivity, lasting 45 minutes, acute pain is usually helped by Excedrin Migraine, but still with right side hanging over pain afterwards, he denies significant nausea, he has migraine headaches about 4-5 times each week. His headache has gotten worse over the past 5 years, trigger for his migraine headaches are hungry, stress, weather change, hot dog,  He is taking Wellbutrin for depression   MRI brain showed focal areas of cortical encephalomalacia and gliosis in the left posterior frontal, left parasagittal parietal and left posterior temporal regions, scattered foci of non-specific gliosis in the left centrum semiovale. These may be sequelae from remote head trauma.  He took Depakote ER  qhs, he has migraine 3-4 times a week, He took imitrex 3-4 times each week as needed for his migraine, followed by drowsiness afterwards. He had couple episodes with flashing light in his visual field followed by migraine headaches.   UPDATE 04/01/2014:  He is taking Depakote ER  qam, he still has headaches, left retrorbital area 2-3 times  each day, last 30-60 minutes. He takes imitrex prn, which did help his headaches. he also take frequent Excedrin migraine 2-3 times each week  He also complains of lower back and shoulder pain, difficulty sleeping.  He has history of obstructive sleep apnea, woke up choking. Complains of difficulty sleeping, also complains of anxiety, depression symptoms  UPDATE April 29th 2016: He continues to complains of frequent headache, used to be at daytime, 5-6 days each week, change of patent in recent 2 weeks, now he has one to 2 headaches at night, wake him up from sleep, if he take imitrex on time, it last 20-30 minutes, if he did not take Imitrex in time, it can last up to one hour, he have to pace around during episode, lying down make it worse,    REVIEW OF SYSTEMS: Full 14 system review of systems performed and notable only for those listed, all others are neg: light sensitivity, blurred vision, dizziness, memory loss, tremor, headaches, agitation, confusion, depression, anxiety, apnea    ALLERGIES: No Known Allergies  HOME MEDICATIONS: Outpatient Prescriptions Prior to Visit  Medication Sig Dispense Refill  . Aspirin-Acetaminophen-Caffeine (EXCEDRIN PO) Take 1 tablet by mouth daily as needed (headache).     Marland Kitchen buPROPion (WELLBUTRIN SR) 150 MG 12 hr tablet Take 150 mg by mouth daily.    . chlorhexidine (PERIDEX) 0.12 % solution     . divalproex (DEPAKOTE ER) 500 MG 24 hr tablet Take 1 tablet (500 mg total) by mouth daily. 90 tablet 3  . naproxen (NAPROSYN) 500 MG tablet Take 1 tablet (500 mg total) by mouth 2 (  two) times daily with a meal. As needed for pain 20 tablet 0  . nortriptyline (PAMELOR) 10 MG capsule TAKE 1 CAPSULE BY MOUTH AT BEDTIME FOR ONE WEEK, THEN 2 EVERY NIGHT AT BEDTIME 180 capsule 3  . SUMAtriptan (IMITREX) 100 MG tablet Take 1 tablet (100 mg total) by mouth every 2 (two) hours as needed for migraine. Maximum 2 tabs q 24hours. 15 tablet 11   No facility-administered  medications prior to visit.    PAST MEDICAL HISTORY: Past Medical History  Diagnosis Date  . Migraine   . Depression   . Brain injury     PAST SURGICAL HISTORY: Past Surgical History  Procedure Laterality Date  . Tracheal surgery      2003  . Abdominal surgery      2003    FAMILY HISTORY: No family history on file.  SOCIAL HISTORY: History   Social History  . Marital Status: Single    Spouse Name: Stepahnie  . Number of Children: 5  . Years of Education: N/A   Occupational History  .      college student   Social History Main Topics  . Smoking status: Current Every Day Smoker    Types: Cigarettes  . Smokeless tobacco: Never Used  . Alcohol Use: 1.2 oz/week    2 Standard drinks or equivalent per week     Comment: Drinks beer on Saturdays  . Drug Use: No  . Sexual Activity: Not on file   Other Topics Concern  . Not on file   Social History Narrative   Patient lives at home with his wife Judeth Cornfield). Patient has five children. Patient goes to college  Part time , He wants to teach. Right handed.   Caffeine- one cup daily.     PHYSICAL EXAM  Filed Vitals:   09/26/14 0832  BP: 138/84  Pulse: 60  Height:  (1.753 m)  Weight: 196 lb (88.905 kg)   Body mass index is 28.93 kg/(m^2).  PHYSICAL EXAMNIATION:  Gen: NAD, conversant, well nourised, obese, well groomed                     Cardiovascular: Regular rate rhythm, no peripheral edema, warm, nontender. Eyes: Conjunctivae clear without exudates or hemorrhage Neck: Supple, no carotid bruise. Pulmonary: Clear to auscultation bilaterally   NEUROLOGICAL EXAM:  MENTAL STATUS: Speech:    Speech is normal; fluent and spontaneous with normal comprehension.  Cognition:    The patient is oriented to person, place, and time;     recent and remote memory intact;     language fluent;     normal attention, concentration,     fund of knowledge.  CRANIAL NERVES: CN II: Visual fields are full to  confrontation. Fundoscopic exam is normal with sharp discs and no vascular changes. Mild left ptosis, left pupil is 1mm smaller than right,   briskly reactive to light. Visual acuity is 20/20 bilaterally. CN III, IV, VI: extraocular movement are normal. Mild left  ptosis. CN V: Facial sensation is intact to pinprick in all 3 divisions bilaterally. Corneal responses are intact.  CN VII: Face is symmetric with normal eye closure and smile. CN VIII: Hearing is normal to rubbing fingers CN IX, X: Palate elevates symmetrically. Phonation is normal. CN XI: Head turning and shoulder shrug are intact CN XII: Tongue is midline with normal movements and no atrophy.  MOTOR: There is no pronator drift of out-stretched arms. Muscle bulk and tone are normal.  Muscle strength is normal.   Shoulder abduction Shoulder external rotation Elbow flexion Elbow extension Wrist flexion Wrist extension Finger abduction Hip flexion Knee flexion Knee extension Ankle dorsi flexion Ankle plantar flexion  R 5 5 5 5 5 5 5 5 5 5 5 5   L 5 5 5 5 5 5 5 5 5 5 5 5     REFLEXES: Reflexes are 2+ and symmetric at the biceps, triceps, knees, and ankles. Plantar responses are flexor.  SENSORY: Light touch, pinprick, position sense, and vibration sense are intact in fingers and toes.  COORDINATION: Rapid alternating movements and fine finger movements are intact. There is no dysmetria on finger-to-nose and heel-knee-shin. There are no abnormal or extraneous movements.   GAIT/STANCE: Posture is normal. Gait is steady with normal steps, base, arm swing, and turning. Heel and toe walking are normal. Tandem gait is normal.  Romberg is absent.  DIAGNOSTIC DATA (LABS, IMAGING, TESTING)  ASSESSMENT AND PLAN  34 y.o. year old male  has a past medical history of Migraine; Depression; and Brain injury. presenting with frequent headaches, now week Each night with right side headache, no autonomic phenomenon,  1, his history somewhat  suggestive of cluster headache, failed to improve by Depakote, will switch him to Topamax 100 mg twice a day, continue nortriptyline 20 mg every night as preventative medications, may consider add on verapamil later, 2, continue Imitrex as needed 3, add on lidocaine applied to right basal as needed for his headaches, 4, indomethacin been 25 mg twice a day 5, return to clinic in 3-4 weeks       Terrilyn SaverYijun Gaia Gullikson [facility] [address], [address] [place], [place] [id number] [phone]

## 2014-10-02 ENCOUNTER — Other Ambulatory Visit: Payer: Self-pay | Admitting: Nurse Practitioner

## 2014-10-02 NOTE — Telephone Encounter (Signed)
OV note from 04/29 says: failed to improve by Depakote, will switch him to Topamax

## 2014-10-07 ENCOUNTER — Telehealth: Payer: Self-pay

## 2014-10-07 ENCOUNTER — Telehealth: Payer: Self-pay | Admitting: Neurology

## 2014-10-07 NOTE — Telephone Encounter (Signed)
Patient would like to speak with someone regarding the Rx. [facility] that Dr. Terrace ArabiaYan wanted him to begin taking. He has some confusion on how to take the medication. Please call and advise.

## 2014-10-07 NOTE — Telephone Encounter (Signed)
I called back.  Relayed Dr Zannie CoveYan's note.  Patient verbalized understanding.  He will call us back if anything further is needed.

## 2014-10-07 NOTE — Telephone Encounter (Signed)
Called patient and left a message stating to call back to get apt rescheduled. Patient was scheduled with Dr. Terrace ArabiaYan on 10/31/14 . Patient needs apt with Dr.Yan 30 minutes. When patient calls back please reschedule thanks Annabelle HarmanDana.

## 2014-10-07 NOTE — Telephone Encounter (Signed)
Harold DusterMichelle, please call patient, I intended for him to apply lidocaine intranasally, if he can spray it intranasally and it works for his headache, he should continue to do so, as long as it does not cause local irritation for him.  Keep follow up appt.

## 2014-10-07 NOTE — Telephone Encounter (Signed)
I called back to clarify.  Patient says the pharmacist told him to apply Lidocaine topically, but he sprayed it in his nose.  Says that did numb everything, and seemed to work.  Would like a message sent to provider asking to clarify if he should apply topically, or use intranasal for future doses.  Please advise.  Thank you.

## 2014-10-08 DIAGNOSIS — G4733 Obstructive sleep apnea (adult) (pediatric): Secondary | ICD-10-CM | POA: Diagnosis not present

## 2014-10-15 IMAGING — CR DG SHOULDER 2+V*L*
2 series · 2 of 2 positions shown · non-contrast
Comparison: None.

CLINICAL DATA: Left shoulder pain

EXAM:
LEFT SHOULDER - 2+ VIEW

[view not recorded (1 of 2)]
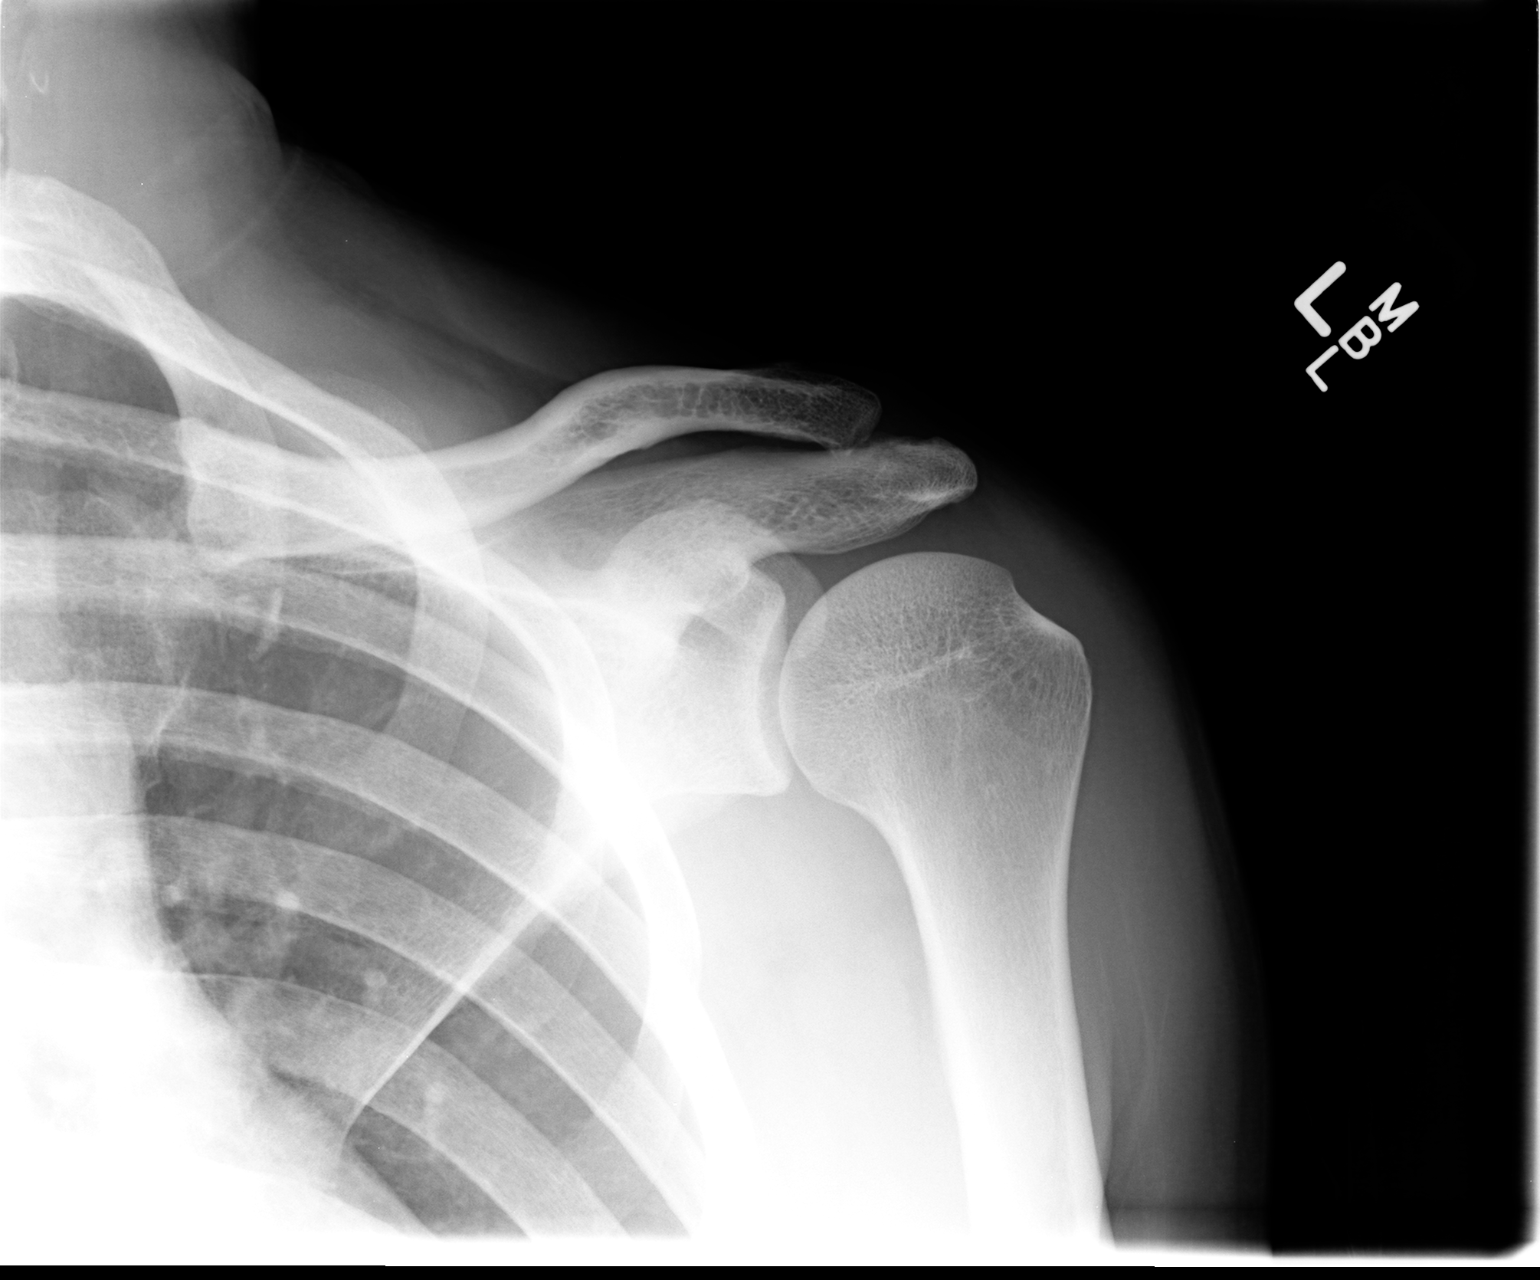

[view not recorded (2 of 2)]
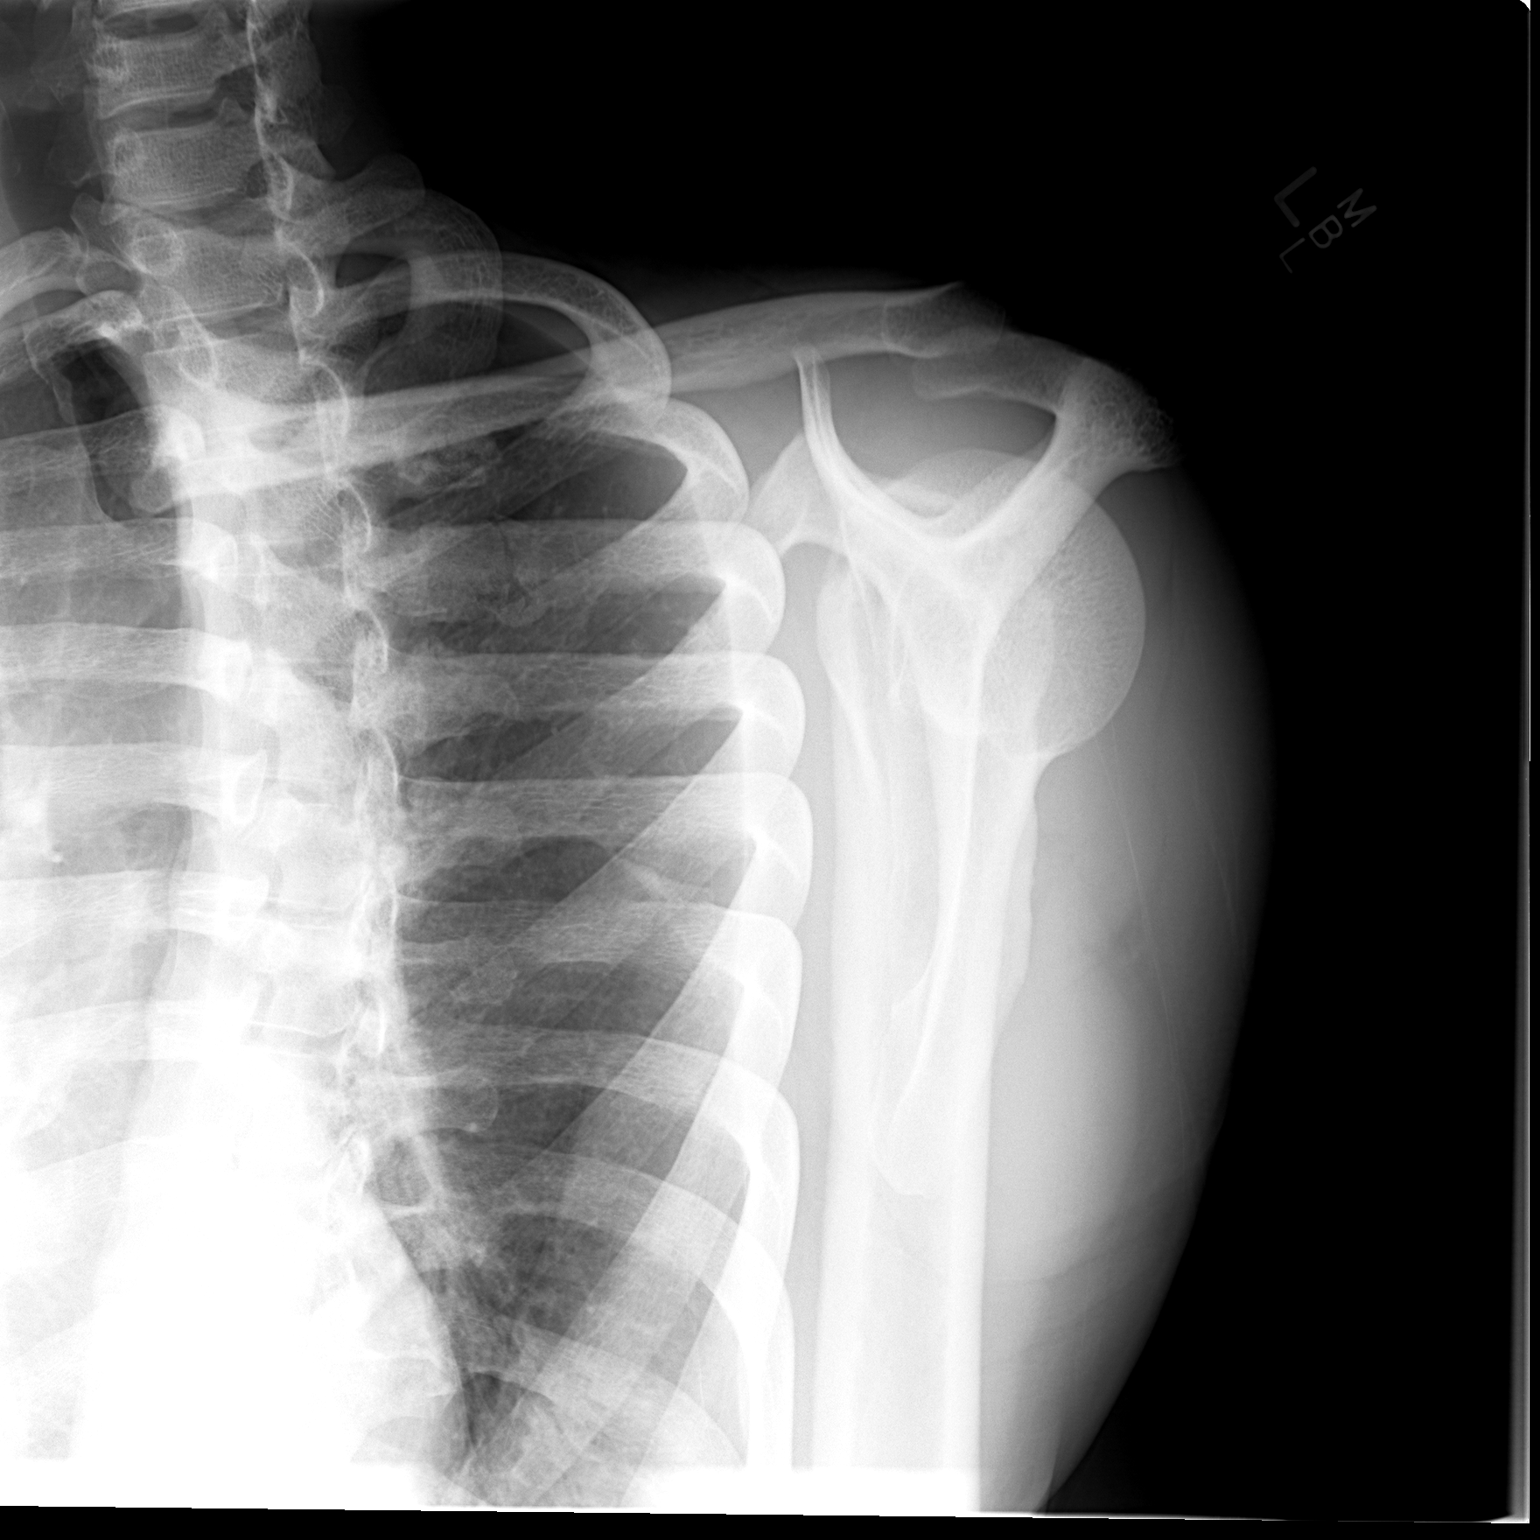

[2 of 2 positions shown; findings below may reference images not displayed]

FINDINGS: There is no evidence of fracture or dislocation. There is no
evidence of arthropathy or other focal bone abnormality. Soft
tissues are unremarkable.
IMPRESSION: Negative.

## 2014-10-18 ENCOUNTER — Other Ambulatory Visit: Payer: Self-pay | Admitting: Nurse Practitioner

## 2014-10-28 ENCOUNTER — Ambulatory Visit: Payer: Medicare Other | Admitting: Neurology

## 2014-10-28 ENCOUNTER — Telehealth: Payer: Self-pay | Admitting: *Deleted

## 2014-10-28 NOTE — Telephone Encounter (Signed)
No showed appt today. 

## 2014-10-31 ENCOUNTER — Ambulatory Visit: Payer: Medicare Other | Admitting: Neurology

## 2014-11-10 ENCOUNTER — Encounter: Payer: Self-pay | Admitting: Neurology

## 2014-11-10 ENCOUNTER — Ambulatory Visit (INDEPENDENT_AMBULATORY_CARE_PROVIDER_SITE_OTHER): Payer: Medicare Other | Admitting: Neurology

## 2014-11-10 VITALS — BP 141/91 | HR 72 | Ht 69.0 in | Wt 189.0 lb

## 2014-11-10 DIAGNOSIS — G44309 Post-traumatic headache, unspecified, not intractable: Secondary | ICD-10-CM | POA: Diagnosis not present

## 2014-11-10 DIAGNOSIS — G43919 Migraine, unspecified, intractable, without status migrainosus: Secondary | ICD-10-CM

## 2014-11-10 NOTE — Progress Notes (Signed)
Chief Complaint  Patient presents with  . Post-traumatic Headache    Feels topiramate has improved his migraines. He has noticed a change in his taste buds.  He is mainly concerned today about his recent decrease in sex drive.     [facility]  PATIENT: Harold Stevens DOB: Oct 10, 1980    HISTORY OF PRESENT ILLNESS: Mr. Harold Stevens, 34 year old male returns for followup.   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  Ever since then, he had short-term memory trouble, frequent headaches, he was treated with Depakote for a while, but he denied a history of recurrent seizure,  He now has 5 children, at age 42, 28, 1, for, 2, he lives with his wife and children, he is dealing with anxiety, depression, to the point of suicidal  His typical migraine is right retro-orbital area severe pounding headaches with associated light noise sensitivity, lasting 45 minutes, acute pain is usually helped by Excedrin Migraine, but still with right side hanging over pain afterwards, he denies significant nausea, he has migraine headaches about 4-5 times each week. His headache has gotten worse over the past 5 years, trigger for his migraine headaches are hungry, stress, weather change, hot dog,  He is taking Wellbutrin for depression   MRI brain showed focal areas of cortical encephalomalacia and gliosis in the left posterior frontal, left parasagittal parietal and left posterior temporal regions, scattered foci of non-specific gliosis in the left centrum semiovale. These may be sequelae from remote head trauma.  He took Depakote ER  qhs, he has migraine 3-4 times a week, He took imitrex 3-4 times each week as needed for his migraine, followed by  drowsiness afterwards. He had couple episodes with flashing light in his visual field followed by migraine headaches.   UPDATE 04/01/2014:  He is taking Depakote ER  qam, he still has headaches, left retrorbital area 2-3 times each day, last 30-60 minutes. He takes imitrex prn, which did help his headaches. he also take frequent Excedrin migraine 2-3 times each week  He also complains of lower back and shoulder pain, difficulty sleeping.  He has history of obstructive sleep apnea, woke up choking. Complains of difficulty sleeping, also complains of anxiety, depression symptoms  UPDATE April 29th 2016: He continues to complains of frequent headache, used to be at daytime, 5-6 days each week, change of patent in recent 2 weeks, now he has one to 2 headaches at night, wake him up from sleep, if he take imitrex on time, it last 20-30 minutes, if he did not take Imitrex in time, it can last up to one hour, he have to pace around during episode, lying down make it worse,   UPDATE November 10 2014:  His headache overall has much improved, but still has 2-3 headaches each week, he no longer has the severe headaches that woke him up at night,  Lidocaine nasal spray has been very helpful, he use it 2-3 times each week, complains of loss of taste,Topamax 100 mg twice a day has been helpful, he also noticed decreased sex drive, he does complains of depression anxiety, short-term memory trouble, confusion episodes,.  REVIEW OF SYSTEMS: Full 14 system review of systems performed and notable only for those listed, all  others are neg: decreased concentration, depression, snoring, apnea, memory loss, headaches    ALLERGIES: No Known Allergies  HOME MEDICATIONS: Outpatient Prescriptions Prior to Visit  Medication Sig Dispense Refill  . buPROPion (WELLBUTRIN SR) 150 MG 12 hr tablet Take 150 mg by mouth daily.    . chlorhexidine (PERIDEX) 0.12 % solution     . indomethacin (INDOCIN) 25 MG capsule Take 1  capsule (25 mg total) by mouth 2 (two) times daily with a meal. 60 capsule 3  . Lidocaine HCl 2 % LIQD Apply 5 mLs topically as needed. 1 Bottle 3  . nortriptyline (PAMELOR) 10 MG capsule TAKE 1 CAPSULE BY MOUTH AT BEDTIME FOR ONE WEEK, THEN 2 EVERY NIGHT AT BEDTIME 180 capsule 3  . SUMAtriptan (IMITREX) 100 MG tablet Take 1 tablet (100 mg total) by mouth every 2 (two) hours as needed for migraine. Maximum 2 tabs q 24hours. 15 tablet 11  . topiramate (TOPAMAX) 100 MG tablet Take 1 tablet (100 mg total) by mouth 2 (two) times daily. 60 tablet 6  . SUMAtriptan (IMITREX) 100 MG tablet TAKE 1 TABLET BY MOUTH EVERY 2 HOURS AS NEEDED FOR MIGRAINE. MAX OF 2 TABLETS EVERY DAY. 15 tablet 6   No facility-administered medications prior to visit.    PAST MEDICAL HISTORY: Past Medical History  Diagnosis Date  . Migraine   . Depression   . Brain injury     PAST SURGICAL HISTORY: Past Surgical History  Procedure Laterality Date  . Tracheal surgery      2003  . Abdominal surgery      2003    FAMILY HISTORY: No family history on file.  SOCIAL HISTORY: History   Social History  . Marital Status: Single    Spouse Name: Stepahnie  . Number of Children: 5  . Years of Education: N/A   Occupational History  .      college student   Social History Main Topics  . Smoking status: Current Every Day Smoker    Types: Cigarettes  . Smokeless tobacco: Never Used  . Alcohol Use: 1.2 oz/week    2 Standard drinks or equivalent per week     Comment: Drinks beer on Saturdays  . Drug Use: No  . Sexual Activity: Not on file   Other Topics Concern  . Not on file   Social History Narrative   Patient lives at home with his wife Harold Stevens). Patient has five children. Patient goes to college  Part time , He wants to teach. Right handed.   Caffeine- one cup daily.     PHYSICAL EXAM  Filed Vitals:   11/10/14 1441  BP: 141/91  Pulse: 72  Height:  (1.753 m)  Weight: 189 lb (85.73 kg)    Body mass index is 27.9 kg/(m^2).  PHYSICAL EXAMNIATION:  Gen: NAD, conversant, well nourised, obese, well groomed                     Cardiovascular: Regular rate rhythm, no peripheral edema, warm, nontender. Eyes: Conjunctivae clear without exudates or hemorrhage Neck: Supple, no carotid bruise. Pulmonary: Clear to auscultation bilaterally   NEUROLOGICAL EXAM:  MENTAL STATUS: Speech:    Speech is normal; fluent and spontaneous with normal comprehension.  Cognition:    The patient is oriented to person, place, and time;     recent and remote memory intact;     language fluent;     normal attention, concentration,     fund of knowledge.  CRANIAL NERVES: CN II: Visual fields are full to confrontation. Fundoscopic exam is normal with sharp discs and no vascular changes. Mild left ptosis, left pupil is 39mm smaller than right,   briskly reactive to light. Visual acuity is 20/20 bilaterally. CN III, IV, VI: extraocular movement are normal. Mild left  ptosis. CN V: Facial sensation is intact to pinprick in all 3 divisions bilaterally. Corneal responses are intact.  CN VII: Face is symmetric with normal eye closure and smile. CN VIII: Hearing is normal to rubbing fingers CN IX, X: Palate elevates symmetrically. Phonation is normal. CN XI: Head turning and shoulder shrug are intact CN XII: Tongue is midline with normal movements and no atrophy.  MOTOR: There is no pronator drift of out-stretched arms. Muscle bulk and tone are normal. Muscle strength is normal.   REFLEXES: Reflexes are 2+ and symmetric at the biceps, triceps, knees, and ankles. Plantar responses are flexor.  SENSORY: Light touch, pinprick, position sense, and vibration sense are intact in fingers and toes.  COORDINATION: Rapid alternating movements and fine finger movements are intact. There is no dysmetria on finger-to-nose and heel-knee-shin. There are no abnormal or extraneous movements.    GAIT/STANCE: Posture is normal. Gait is steady with normal steps, base, arm swing, and turning. Heel and toe walking are normal. Tandem gait is normal.  Romberg is absent.  DIAGNOSTIC DATA (LABS, IMAGING, TESTING)  ASSESSMENT AND PLAN  34 y.o. year old    1, traumatic brain injury, frequent headaches, with migraine features, keep Topamax 100 mg twice a day, Imitrex as needed, also lidocaine nasal spray as needed, which has been very helpful, 2. Indomethacin  25 mg twice a day 3, add on nortriptyline 10 mg, titrating to 20 mg every night as headache prevention 4. Also complains of confusion episodes, had a severe traumatic brain injury in the past, EEG for possible   Orders Placed This Encounter  Procedures  . EEG adult    New Prescriptions   No medications on file    Medications Discontinued During This Encounter  Medication Reason  . SUMAtriptan (IMITREX) 100 MG tablet Duplicate  . nortriptyline (PAMELOR) 10 MG capsule     Return in about 1 month (around 12/10/2014).      [facility] [address], [address] [place], [place] [address] [phone]

## 2014-11-13 DIAGNOSIS — G4733 Obstructive sleep apnea (adult) (pediatric): Secondary | ICD-10-CM | POA: Diagnosis not present

## 2014-11-13 DIAGNOSIS — R063 Periodic breathing: Secondary | ICD-10-CM | POA: Diagnosis not present

## 2014-11-18 ENCOUNTER — Ambulatory Visit (INDEPENDENT_AMBULATORY_CARE_PROVIDER_SITE_OTHER): Payer: Medicare Other | Admitting: Neurology

## 2014-11-18 DIAGNOSIS — G43919 Migraine, unspecified, intractable, without status migrainosus: Secondary | ICD-10-CM

## 2014-11-18 DIAGNOSIS — G44309 Post-traumatic headache, unspecified, not intractable: Secondary | ICD-10-CM

## 2014-11-20 NOTE — Procedures (Signed)
   HISTORY: 34 year old with male, complains of short-term memory disturbance, migraine headaches.  TECHNIQUE:  16 channel EEG was performed based on standard 10-16 international system. One channel was dedicated to EKG, which has demonstrates normal sinus rhythm of beats per minutes.  Upon awakening, the posterior background activity was mildly dysrhythmic, in alpha range, reactive to eye opening and closure, at the beginning of the tracing, there was frequent muscle artifact, electrode artifact, mainly involving P3 lead.  There was no evidence of epileptiform discharge.  Photic stimulation was performed, which induced a symmetric photic driving.  Hyperventilation was performed, there was no abnormality elicit.  No sleep was achieved.  CONCLUSION: This is a  normal awake EEG.  There is no electrodiagnostic evidence of epileptiform discharge

## 2014-12-22 ENCOUNTER — Ambulatory Visit (INDEPENDENT_AMBULATORY_CARE_PROVIDER_SITE_OTHER): Payer: Medicare Other | Admitting: Neurology

## 2014-12-22 ENCOUNTER — Encounter: Payer: Self-pay | Admitting: Neurology

## 2014-12-22 VITALS — BP 143/79 | HR 74 | Ht 69.0 in | Wt 190.0 lb

## 2014-12-22 DIAGNOSIS — G44309 Post-traumatic headache, unspecified, not intractable: Secondary | ICD-10-CM

## 2014-12-22 DIAGNOSIS — G43919 Migraine, unspecified, intractable, without status migrainosus: Secondary | ICD-10-CM

## 2014-12-22 MED ORDER — SUMATRIPTAN SUCCINATE 100 MG PO TABS: 100.0000 mg | ORAL_TABLET | ORAL | Status: AC | PRN

## 2014-12-22 MED ORDER — VERAPAMIL HCL ER 100 MG PO CP24
100.0000 mg | ORAL_CAPSULE | Freq: Every day | ORAL | Status: DC
Start: 2014-12-22 — End: 2015-09-22

## 2014-12-22 NOTE — Progress Notes (Signed)
Chief Complaint  Patient presents with  . Migraine    He is still getting daily headaches in varying degrees.  His more severe headaches respond well to sumatriptan.  He would like the results of his EEG.      [facility]  PATIENT: Harold Stevens DOB: 1981/05/09    HISTORY OF PRESENT ILLNESS: Harold Stevens, 34 year old male returns for followup.   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  Ever since then, he had short-term memory trouble, frequent headaches, he was treated with Depakote for a while, but he denied a history of recurrent seizure,  He now has 5 children, at age 15, 47, 66, for, 2, he lives with his wife and children, he is dealing with anxiety, depression, to the point of suicidal  His typical migraine is right retro-orbital area severe pounding headaches with associated light noise sensitivity, lasting 45 minutes, acute pain is usually helped by Excedrin Migraine, but still with right side hanging over pain afterwards, he denies significant nausea, he has migraine headaches about 4-5 times each week. His headache has gotten worse over the past 5 years, trigger for his migraine headaches are hungry, stress, weather change, hot dog,  He is taking Wellbutrin for depression   MRI brain showed focal areas of cortical encephalomalacia and gliosis in the left posterior frontal, left parasagittal parietal and left posterior temporal regions, scattered foci of non-specific gliosis in the left centrum semiovale. These may be sequelae from remote head trauma.  He took Depakote ER 500mg  qhs, he has migraine 3-4 times a week, He took imitrex 3-4 times each week as needed for his migraine, followed by drowsiness afterwards.  He had couple episodes with flashing light in his visual field followed by migraine headaches.   UPDATE 04/01/2014:  He is taking Depakote ER 500mg  qam, he still has headaches, left retrorbital area 2-3 times each day, last 30-60 minutes. He takes imitrex prn, which did help his headaches. he also take frequent Excedrin migraine 2-3 times each week  He also complains of lower back and shoulder pain, difficulty sleeping.  He has history of obstructive sleep apnea, woke up choking. Complains of difficulty sleeping, also complains of anxiety, depression symptoms  UPDATE April 29th 2016: He continues to complains of frequent headache, used to be at daytime, 5-6 days each week, change of pattern in recent 2 weeks, now he has one to 2 headaches at night, wake him up from sleep, if he take imitrex on time, it last 20-30 minutes, if he did not take Imitrex in time, it can last up to one hour, he have to pace around during episode, lying down make it worse,   UPDATE November 10 2014: His headache overall has much improved, but still has 2-3 headaches each week, he no longer has the severe headaches that woke him up at night,  Lidocaine nasal spray has been very helpful, he use it 2-3 times each week, complains of loss of taste,Topamax 100 mg twice a day has been helpful, he also noticed decreased sex drive, he does complains of depression anxiety, short-term memory trouble, confusion episodes,.  UPDATE December 22 2014: He is now taking Topamax 100 mg twice a day, complains of altered taste on  carbonated drinks. He is also taking nortriptyline 20 mg every night, he also has obstructive sleep apnea, sleep using CPAP machine, reported good night sleep, But he continue have almost daily right retro-orbital area severe pounding headaches, 7 out of 10, taking Imitrex as needed 2-3 times each week, rest of the time he take Excedrin Migraine, which was helpful as well.  He supposed to take Wellbutrin for his  depression, but is missing dosage, he no longer complains of decreased sex drive,    REVIEW OF SYSTEMS: Full 14 system review of systems performed and notable only for those listed, all others are neg: Difficulty urinate, frequent urination, apnea, snoring, memory loss, headaches, speech difficulty, agitation, decreased concentration, depression anxiety    ALLERGIES: No Known Allergies  HOME MEDICATIONS: Outpatient Prescriptions Prior to Visit  Medication Sig Dispense Refill  . Lidocaine HCl 2 % LIQD Apply 5 mLs topically as needed. 1 Bottle 3  . SUMAtriptan (IMITREX) 100 MG tablet Take 1 tablet (100 mg total) by mouth every 2 (two) hours as needed for migraine. Maximum 2 tabs q 24hours. 15 tablet 11  . topiramate (TOPAMAX) 100 MG tablet Take 1 tablet (100 mg total) by mouth 2 (two) times daily. 60 tablet 6  . buPROPion (WELLBUTRIN SR) 150 MG 12 hr tablet Take 150 mg by mouth daily.    . chlorhexidine (PERIDEX) 0.12 % solution     . indomethacin (INDOCIN) 25 MG capsule Take 1 capsule (25 mg total) by mouth 2 (two) times daily with a meal. 60 capsule 3   No facility-administered medications prior to visit.    PAST MEDICAL HISTORY: Past Medical History  Diagnosis Date  . Migraine   . Depression   . Brain injury     PAST SURGICAL HISTORY: Past Surgical History  Procedure Laterality Date  . Tracheal surgery      2003  . Abdominal surgery      2003    FAMILY HISTORY: No family history on file.  SOCIAL HISTORY: History   Social History  . Marital Status: Single    Spouse Name: Stepahnie  . Number of Children: 5  . Years of Education: N/A   Occupational History  .      college student   Social History Main Topics  . Smoking status: Current Every Day Smoker    Types: Cigarettes  . Smokeless tobacco: Never Used  . Alcohol Use: 1.2 oz/week    2 Standard drinks or equivalent per week     Comment: Drinks beer on Saturdays  . Drug Use: No  . Sexual Activity: Not on  file   Other Topics Concern  . Not on file   Social History Narrative   Patient lives at home with his wife Judeth Cornfield). Patient has five children. Patient goes to college  Part time , He wants to teach. Right handed.   Caffeine- one cup daily.     PHYSICAL EXAM  Filed Vitals:   12/22/14 1010  BP: 143/79  Pulse: 74  Height: 5\' 9"  (1.753 m)  Weight: 190 lb (86.183 kg)   Body mass index is 28.05 kg/(m^2).  PHYSICAL EXAMNIATION:  Gen: NAD, conversant, well nourised, obese, well groomed                     Cardiovascular: Regular rate rhythm, no peripheral edema, warm, nontender. Eyes: Conjunctivae clear without exudates or hemorrhage Neck: Supple, no carotid bruise. Pulmonary: Clear to auscultation bilaterally   NEUROLOGICAL EXAM:  MENTAL  STATUS: Speech: Stuttering of speech Cognition:    The patient is oriented to person, place, and time;     recent and remote memory intact;     language fluent;     normal attention, concentration,     fund of knowledge.  CRANIAL NERVES: CN II: Visual fields are full to confrontation. Fundoscopic exam is normal with sharp discs and no vascular changes. Mild left ptosis, left pupil is 1mm smaller than right,   briskly reactive to light. Visual acuity is 20/20 bilaterally. CN III, IV, VI: extraocular movement are normal. Mild left  ptosis. CN V: Facial sensation is intact to pinprick in all 3 divisions bilaterally. Corneal responses are intact.  CN VII: Face is symmetric with normal eye closure and smile. CN VIII: Hearing is normal to rubbing fingers CN IX, X: Palate elevates symmetrically. Phonation is normal. CN XI: Head turning and shoulder shrug are intact CN XII: Tongue is midline with normal movements and no atrophy.  MOTOR: There is no pronator drift of out-stretched arms. Muscle bulk and tone are normal. Muscle strength is normal.   REFLEXES: Reflexes are 2+ and symmetric at the biceps, triceps, knees, and ankles. Plantar  responses are flexor.  SENSORY: Light touch, pinprick, position sense, and vibration sense are intact in fingers and toes.  COORDINATION: Rapid alternating movements and fine finger movements are intact. There is no dysmetria on finger-to-nose and heel-knee-shin. There are no abnormal or extraneous movements.   GAIT/STANCE: Posture is normal. Gait is steady with normal steps, base, arm swing, and turning. Heel and toe walking are normal. Tandem gait is normal.  Romberg is absent.  DIAGNOSTIC DATA (LABS, IMAGING, TESTING)  ASSESSMENT AND PLAN  34 y.o. year old    1. Traumatic brain injury 2. Frequent headaches, with migraine features, keep Topamax 100 mg twice a day, nortriptyline 20 mg every night, add on verapamil 100 mg every night, Imitrex as needed, also lidocaine nasal spray as needed for severe headaches, which has been very helpful, Return to clinic with nurse practitioner in 3 months   No orders of the defined types were placed in this encounter.    New Prescriptions   No medications on file    Medications Discontinued During This Encounter  Medication Reason  . buPROPion (WELLBUTRIN SR) 150 MG 12 hr tablet Patient Preference  . chlorhexidine (PERIDEX) 0.12 % solution Patient Preference  . indomethacin (INDOCIN) 25 MG capsule Patient Preference    No Follow-up on file.      [facility] [address], [address] [place], [place] [address] [phone]

## 2014-12-31 DIAGNOSIS — S098XXS Other specified injuries of head, sequela: Secondary | ICD-10-CM | POA: Diagnosis not present

## 2014-12-31 DIAGNOSIS — R399 Unspecified symptoms and signs involving the genitourinary system: Secondary | ICD-10-CM | POA: Diagnosis not present

## 2014-12-31 DIAGNOSIS — Z125 Encounter for screening for malignant neoplasm of prostate: Secondary | ICD-10-CM | POA: Diagnosis not present

## 2014-12-31 DIAGNOSIS — G43909 Migraine, unspecified, not intractable, without status migrainosus: Secondary | ICD-10-CM | POA: Diagnosis not present

## 2014-12-31 DIAGNOSIS — Z79899 Other long term (current) drug therapy: Secondary | ICD-10-CM | POA: Diagnosis not present

## 2014-12-31 DIAGNOSIS — F329 Major depressive disorder, single episode, unspecified: Secondary | ICD-10-CM | POA: Diagnosis not present

## 2014-12-31 DIAGNOSIS — N529 Male erectile dysfunction, unspecified: Secondary | ICD-10-CM | POA: Diagnosis not present

## 2014-12-31 DIAGNOSIS — R3912 Poor urinary stream: Secondary | ICD-10-CM | POA: Diagnosis not present

## 2014-12-31 DIAGNOSIS — Z13220 Encounter for screening for lipoid disorders: Secondary | ICD-10-CM | POA: Diagnosis not present

## 2014-12-31 DIAGNOSIS — Z136 Encounter for screening for cardiovascular disorders: Secondary | ICD-10-CM | POA: Diagnosis not present

## 2014-12-31 DIAGNOSIS — Z0001 Encounter for general adult medical examination with abnormal findings: Secondary | ICD-10-CM | POA: Diagnosis not present

## 2015-02-16 DIAGNOSIS — G43909 Migraine, unspecified, not intractable, without status migrainosus: Secondary | ICD-10-CM | POA: Diagnosis not present

## 2015-02-16 DIAGNOSIS — F524 Premature ejaculation: Secondary | ICD-10-CM | POA: Diagnosis not present

## 2015-02-16 DIAGNOSIS — N529 Male erectile dysfunction, unspecified: Secondary | ICD-10-CM | POA: Diagnosis not present

## 2015-02-27 DIAGNOSIS — Z3009 Encounter for other general counseling and advice on contraception: Secondary | ICD-10-CM | POA: Diagnosis not present

## 2015-02-27 DIAGNOSIS — R3912 Poor urinary stream: Secondary | ICD-10-CM | POA: Diagnosis not present

## 2015-02-27 DIAGNOSIS — R3916 Straining to void: Secondary | ICD-10-CM | POA: Diagnosis not present

## 2015-02-27 DIAGNOSIS — F524 Premature ejaculation: Secondary | ICD-10-CM | POA: Diagnosis not present

## 2015-03-17 ENCOUNTER — Telehealth: Payer: Self-pay | Admitting: Neurology

## 2015-03-17 NOTE — Telephone Encounter (Signed)
Called and left message for patient asking him to call Duwayne Heckanielle / Annabelle HarmanDana back wanting to follow up with him to see if he still wants to have his  Botox injections.

## 2015-03-24 ENCOUNTER — Telehealth: Payer: Self-pay | Admitting: Neurology

## 2015-03-24 ENCOUNTER — Encounter: Payer: Self-pay | Admitting: Nurse Practitioner

## 2015-03-24 ENCOUNTER — Ambulatory Visit (INDEPENDENT_AMBULATORY_CARE_PROVIDER_SITE_OTHER): Payer: Medicare Other | Admitting: Nurse Practitioner

## 2015-03-24 VITALS — BP 146/94 | HR 72 | Ht 70.25 in | Wt 173.0 lb

## 2015-03-24 DIAGNOSIS — G44309 Post-traumatic headache, unspecified, not intractable: Secondary | ICD-10-CM

## 2015-03-24 DIAGNOSIS — G43919 Migraine, unspecified, intractable, without status migrainosus: Secondary | ICD-10-CM | POA: Diagnosis not present

## 2015-03-24 MED ORDER — TOPIRAMATE 100 MG PO TABS: 100.0000 mg | ORAL_TABLET | Freq: Two times a day (BID) | ORAL | Status: DC

## 2015-03-24 MED ORDER — NORTRIPTYLINE HCL 10 MG PO CAPS: 20.0000 mg | ORAL_CAPSULE | Freq: Every day | ORAL | Status: DC

## 2015-03-24 NOTE — Telephone Encounter (Signed)
Patient saw Harold Stevens today 03/24/15. Patient does not want to have Botox Injections.

## 2015-03-24 NOTE — Progress Notes (Signed)
[facility]  PATIENT: Harold Stevens DOB: [birth year]   REASON FOR VISIT:follow-up for posttraumatic headache, migraine HISTORY FROM:patient    HISTORY OF PRESENT ILLNESS: HISTORY YYMr. Stevens, 34 year old male returns for followup.  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 Ever since then, he had short-term memory trouble, frequent headaches, he was treated with Depakote for a while, but he denied a history of recurrent seizure,  He now has 5 children, at age 30, 50, 51, for, 2, he lives with his wife and children, he is dealing with anxiety, depression, to the point of suicidal His typical migraine is right retro-orbital area severe pounding headaches with associated light noise sensitivity, lasting 45 minutes, acute pain is usually helped by Excedrin Migraine, but still with right side hanging over pain afterwards, he denies significant nausea, he has migraine headaches about 4-5 times each week. His headache has gotten worse over the past 5 years, trigger for his migraine headaches are hungry, stress, weather change, hot dog,  He is taking Wellbutrin for depression  MRI brain showed focal areas of cortical encephalomalacia and gliosis in the left posterior frontal, left parasagittal parietal and left posterior temporal regions, scattered foci of non-specific gliosis in the left centrum semiovale. These may be sequelae from remote head trauma. He took Depakote ER  qhs, he has migraine 3-4 times a week, He took imitrex 3-4 times each week as needed for his migraine, followed by drowsiness afterwards. He had couple episodes with flashing light in his visual field followed by migraine headaches.   UPDATE 04/01/2014:He  is taking Depakote ER  qam, he still has headaches, left retrorbital area 2-3 times each day, last 30-60 minutes. He takes imitrex prn, which did help his headaches. he also take frequent Excedrin migraine 2-3 times each week He also complains of lower back and shoulder pain, difficulty sleeping. He has history of obstructive sleep apnea, woke up choking. Complains of difficulty sleeping, also complains of anxiety, depression symptoms  UPDATE April 29th 2016: He continues to complains of frequent headache, used to be at daytime, 5-6 days each week, change of pattern in recent 2 weeks, now he has one to 2 headaches at night, wake him up from sleep, if he take imitrex on time, it last 20-30 minutes, if he did not take Imitrex in time, it can last up to one hour, he have to pace around during episode, lying down make it worse,   UPDATE November 10 2014: His headache overall has much improved, but still has 2-3 headaches each week, he no longer has the severe headaches that woke him up at night, Lidocaine nasal spray has been very helpful, he use it 2-3 times each week, complains of loss of taste,Topamax 100 mg twice a day has been helpful, he also noticed decreased sex drive, he does complains of depression anxiety, short-term memory trouble, confusion episodes,.  UPDATE December 22 2014: He is now taking Topamax 100 mg twice a day, complains of altered taste on carbonated drinks. He is also taking nortriptyline 20 mg every night, he also has obstructive sleep apnea, sleep using CPAP machine, reported good night sleep, But he continue have almost daily right retro-orbital area severe pounding headaches, 7 out of 10,  taking Imitrex as needed 2-3 times each week, rest of the time he take Excedrin Migraine, which was helpful as well.He supposed to take Wellbutrin for his depression, but is missing dosage, he no longer complains of decreased sex drive,  UPDATE 16/10/960410/25/2016. Harold Stevens, 34 year old male returns for  follow-up. He was previously seen by Dr. Terrace ArabiaYan for routine follow-up. His last visit was 12/22/2014. He says today he felt like his headaches are better he has not had a headache over 2 days and he is used to having headaches every day. Lidocaine topically works well. In addition he takes Imitrex which relieves his acute headache. He is also on Pamelor Topamax and verapamil as preventatives. He is not interested in Botox injections. He returns for reevaluation. He also has a history of sleep apnea and reports using CPAP at night.  REVIEW OF SYSTEMS: Full 14 system review of systems performed and notable only for those listed, all others are neg:  Constitutional: neg  Cardiovascular: neg Ear/Nose/Throat: neg  Skin: neg Eyes: neg Respiratory: neg Gastroitestinal: urinary frequency Hematology/Lymphatic: neg  Endocrine: neg Musculoskeletal:neg Allergy/Immunology: neg Neurological: memory loss, headache, speech difficulty Psychiatric: anxiety and depression Sleep : obstructive sleep apnea with CPAP   ALLERGIES: No Known Allergies  HOME MEDICATIONS: Outpatient Prescriptions Prior to Visit  Medication Sig Dispense Refill  . Lidocaine HCl 2 % LIQD Apply 5 mLs topically as needed. 1 Bottle 3  . nortriptyline (PAMELOR) 10 MG capsule Take 2 at bedtime.    . SUMAtriptan (IMITREX) 100 MG tablet Take 1 tablet (100 mg total) by mouth every 2 (two) hours as needed for migraine. Maximum 2 tabs q 24hours. 15 tablet 11  . topiramate (TOPAMAX) 100 MG tablet Take 1 tablet (100 mg total) by mouth 2 (two) times daily. 60 tablet 6  . verapamil (VERELAN) 100 MG 24 hr capsule Take 1 capsule (100 mg total) by mouth at bedtime. 30 capsule 11   No facility-administered medications prior to visit.    PAST MEDICAL HISTORY: Past Medical History  Diagnosis Date  . Migraine   . Depression   . Brain injury [facility](HCC)     PAST SURGICAL HISTORY: Past Surgical History  Procedure Laterality Date  . Tracheal surgery       2003  . Abdominal surgery      2003    FAMILY HISTORY: History reviewed. No pertinent family history.  SOCIAL HISTORY: Social History   Social History  . Marital Status: Single    Spouse Name: Stepahnie  . Number of Children: 5  . Years of Education: N/A   Occupational History  .      college student   Social History Main Topics  . Smoking status: Current Every Day Smoker -- 0.25 packs/day    Types: Cigarettes  . Smokeless tobacco: Never Used  . Alcohol Use: 0.0 oz/week    0 Standard drinks or equivalent per week  . Drug Use: No  . Sexual Activity: Not on file   Other Topics Concern  . Not on file   Social History Narrative   Patient lives at home with [name]. Patient has five children. Patient goes to college  Part time , He wants to teach. Right handed.   Caffeine- one cup daily.     PHYSICAL EXAM  Filed Vitals:   03/24/15 1014  BP: 146/94  Pulse: 72  Height: 5' 10.25" (1.784 m)  Weight: 173 lb (78.472 kg)   Body mass index is 24.66 kg/(m^2).  Generalized: Well  developed, in no acute distress  Head: normocephalic and atraumatic,. Oropharynx benign  Neck: Supple, no carotid bruits  Cardiac: Regular rate rhythm, no murmur  Musculoskeletal: No deformity   Neurological examination   Mentation: Alert oriented to time, place, history taking. Attention span and concentration appropriate. Recent and remote memory intact.  Follows all commands, stuttering of  speech  Cranial nerve II-XII: Fundoscopic exam reveals sharp disc margins.Pupils were equal round reactive to light extraocular movements were full, mild left ptosis, visual field were full on confrontational test. Facial sensation and strength were normal. hearing was intact to finger rubbing bilaterally. Uvula tongue midline. head turning and shoulder shrug were normal and symmetric.Tongue protrusion into cheek strength was normal. Motor: normal bulk and tone, full strength in the BUE,  BLE, fine finger movements normal, no pronator drift. No focal weakness Sensory: normal and symmetric to light touch, pinprick, and  Vibration, proprioception  Coordination: finger-nose-finger, heel-to-shin bilaterally, no dysmetria Reflexes: Brachioradialis 2/2, biceps 2/2, triceps 2/2, patellar 2/2, Achilles 2/2, plantar responses were flexor bilaterally. Gait and Station: Rising up from seated position without assistance, normal stance,  moderate stride, good arm swing, smooth turning, able to perform tiptoe, and heel walking without difficulty. Tandem gait is steady  DIAGNOSTIC DATA (LABS, IMAGING, TESTING)  ASSESSMENT AND PLAN  34 y.o. year old male  has a past medical history of Migraine; Depression; and Brain injury (HCC). here to follow-up for his headaches.  Patient is not interested in Botox injections, the reasoning for this was discussed and questions were answered.  He will continue lidocaine topically  when necessary as needed Continue nortriptyline 2 tablets at night will refill Continue Imitrex 100 mg when necessary acute headache Continue Topamax 100 mg twice daily will refill Continue verapamil 100 mg at bedtime Follow-up in 6 monthsVst time 15 min Nilda Riggs, [facility], [facility], APRN  [facility] [address] [place], [place] [address] [phone]

## 2015-03-24 NOTE — Patient Instructions (Addendum)
Patient is not interested in Botox injections He will continue lidocaine topically  when necessary as needed Continue nortriptyline 2 tablets at night will refill Continue Imitrex 100 mg when necessary acute headache Continue Topamax 100 mg twice daily will refill Continue verapamil 100 mg at bedtime Follow-up in 6 months

## 2015-03-25 NOTE — Progress Notes (Signed)
I have reviewed and agreed above plan. 

## 2015-09-22 ENCOUNTER — Encounter: Payer: Self-pay | Admitting: Nurse Practitioner

## 2015-09-22 ENCOUNTER — Ambulatory Visit (INDEPENDENT_AMBULATORY_CARE_PROVIDER_SITE_OTHER): Payer: Medicare Other | Admitting: Nurse Practitioner

## 2015-09-22 VITALS — BP 141/89 | HR 68 | Ht 70.25 in | Wt 190.2 lb

## 2015-09-22 DIAGNOSIS — G43919 Migraine, unspecified, intractable, without status migrainosus: Secondary | ICD-10-CM

## 2015-09-22 DIAGNOSIS — G44309 Post-traumatic headache, unspecified, not intractable: Secondary | ICD-10-CM | POA: Diagnosis not present

## 2015-09-22 MED ORDER — NORTRIPTYLINE HCL 10 MG PO CAPS: 20.0000 mg | ORAL_CAPSULE | Freq: Every day | ORAL | Status: AC

## 2015-09-22 MED ORDER — VERAPAMIL HCL ER 100 MG PO CP24: 100.0000 mg | ORAL_CAPSULE | Freq: Every day | ORAL | Status: AC

## 2015-09-22 MED ORDER — TOPIRAMATE 100 MG PO TABS: 100.0000 mg | ORAL_TABLET | Freq: Two times a day (BID) | ORAL | Status: AC

## 2015-09-22 NOTE — Patient Instructions (Signed)
Continue nortriptyline 2 tablets at night will refill Continue Imitrex 100 mg when necessary acute headache does not need refills Continue Topamax 100 mg twice daily will refill Continue verapamil 100 mg at bedtime will refill Follow-up in1 year next with Dr. Terrace ArabiaYan
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
## 2015-09-28 NOTE — Progress Notes (Signed)
I have reviewed and agreed above plan.
